# Patient Record
Sex: Female | Born: 1960 | Race: Black or African American | Hispanic: No | Smoking: Current every day smoker
Health system: Southern US, Community
[De-identification: ages and names within clinical notes are randomized; demographics above are authoritative.]

## PROBLEM LIST (undated history)

## (undated) DIAGNOSIS — D649 Anemia, unspecified: Secondary | ICD-10-CM

## (undated) DIAGNOSIS — F419 Anxiety disorder, unspecified: Secondary | ICD-10-CM

## (undated) DIAGNOSIS — F329 Major depressive disorder, single episode, unspecified: Secondary | ICD-10-CM

## (undated) DIAGNOSIS — E119 Type 2 diabetes mellitus without complications: Secondary | ICD-10-CM

## (undated) DIAGNOSIS — I10 Essential (primary) hypertension: Secondary | ICD-10-CM

## (undated) DIAGNOSIS — K5792 Diverticulitis of intestine, part unspecified, without perforation or abscess without bleeding: Secondary | ICD-10-CM

## (undated) DIAGNOSIS — J45909 Unspecified asthma, uncomplicated: Secondary | ICD-10-CM

## (undated) DIAGNOSIS — K589 Irritable bowel syndrome without diarrhea: Secondary | ICD-10-CM

## (undated) DIAGNOSIS — I219 Acute myocardial infarction, unspecified: Secondary | ICD-10-CM

## (undated) DIAGNOSIS — I2510 Atherosclerotic heart disease of native coronary artery without angina pectoris: Secondary | ICD-10-CM

## (undated) DIAGNOSIS — E079 Disorder of thyroid, unspecified: Secondary | ICD-10-CM

## (undated) DIAGNOSIS — E785 Hyperlipidemia, unspecified: Secondary | ICD-10-CM

## (undated) DIAGNOSIS — F32A Depression, unspecified: Secondary | ICD-10-CM

## (undated) HISTORY — PX: ABDOMINAL HYSTERECTOMY: SHX81

## (undated) HISTORY — PX: JOINT REPLACEMENT: SHX530

## (undated) HISTORY — PX: CARDIAC SURGERY: SHX584

## (undated) HISTORY — PX: SKIN GRAFT: SHX250

---

## 2017-04-27 ENCOUNTER — Emergency Department: Payer: Medicaid Other

## 2017-04-27 ENCOUNTER — Observation Stay
Admission: EM | Admit: 2017-04-27 | Discharge: 2017-04-28 | Disposition: A | Discharge status: Home / Self Care | Payer: Medicaid Other | Attending: Internal Medicine | Admitting: Internal Medicine

## 2017-04-27 DIAGNOSIS — Z880 Allergy status to penicillin: Secondary | ICD-10-CM | POA: Insufficient documentation

## 2017-04-27 DIAGNOSIS — R748 Abnormal levels of other serum enzymes: Secondary | ICD-10-CM | POA: Insufficient documentation

## 2017-04-27 DIAGNOSIS — F329 Major depressive disorder, single episode, unspecified: Secondary | ICD-10-CM | POA: Diagnosis not present

## 2017-04-27 DIAGNOSIS — F17200 Nicotine dependence, unspecified, uncomplicated: Secondary | ICD-10-CM | POA: Diagnosis not present

## 2017-04-27 DIAGNOSIS — N179 Acute kidney failure, unspecified: Secondary | ICD-10-CM | POA: Insufficient documentation

## 2017-04-27 DIAGNOSIS — Z881 Allergy status to other antibiotic agents status: Secondary | ICD-10-CM | POA: Diagnosis not present

## 2017-04-27 DIAGNOSIS — R9431 Abnormal electrocardiogram [ECG] [EKG]: Secondary | ICD-10-CM | POA: Diagnosis not present

## 2017-04-27 DIAGNOSIS — Z9049 Acquired absence of other specified parts of digestive tract: Secondary | ICD-10-CM | POA: Diagnosis not present

## 2017-04-27 DIAGNOSIS — R079 Chest pain, unspecified: Secondary | ICD-10-CM | POA: Diagnosis present

## 2017-04-27 DIAGNOSIS — R110 Nausea: Secondary | ICD-10-CM | POA: Insufficient documentation

## 2017-04-27 DIAGNOSIS — R0789 Other chest pain: Secondary | ICD-10-CM | POA: Diagnosis not present

## 2017-04-27 DIAGNOSIS — E785 Hyperlipidemia, unspecified: Secondary | ICD-10-CM | POA: Diagnosis not present

## 2017-04-27 DIAGNOSIS — Z888 Allergy status to other drugs, medicaments and biological substances status: Secondary | ICD-10-CM | POA: Insufficient documentation

## 2017-04-27 DIAGNOSIS — I10 Essential (primary) hypertension: Secondary | ICD-10-CM | POA: Diagnosis present

## 2017-04-27 DIAGNOSIS — Z951 Presence of aortocoronary bypass graft: Secondary | ICD-10-CM | POA: Diagnosis not present

## 2017-04-27 DIAGNOSIS — I2510 Atherosclerotic heart disease of native coronary artery without angina pectoris: Secondary | ICD-10-CM | POA: Diagnosis not present

## 2017-04-27 DIAGNOSIS — F419 Anxiety disorder, unspecified: Secondary | ICD-10-CM | POA: Diagnosis not present

## 2017-04-27 DIAGNOSIS — I252 Old myocardial infarction: Secondary | ICD-10-CM | POA: Diagnosis not present

## 2017-04-27 DIAGNOSIS — R778 Other specified abnormalities of plasma proteins: Secondary | ICD-10-CM

## 2017-04-27 DIAGNOSIS — Z9660 Presence of unspecified orthopedic joint implant: Secondary | ICD-10-CM | POA: Insufficient documentation

## 2017-04-27 DIAGNOSIS — Z79899 Other long term (current) drug therapy: Secondary | ICD-10-CM | POA: Diagnosis not present

## 2017-04-27 DIAGNOSIS — Z794 Long term (current) use of insulin: Secondary | ICD-10-CM | POA: Diagnosis not present

## 2017-04-27 DIAGNOSIS — J449 Chronic obstructive pulmonary disease, unspecified: Secondary | ICD-10-CM | POA: Diagnosis not present

## 2017-04-27 DIAGNOSIS — E039 Hypothyroidism, unspecified: Secondary | ICD-10-CM | POA: Diagnosis not present

## 2017-04-27 DIAGNOSIS — Z882 Allergy status to sulfonamides status: Secondary | ICD-10-CM | POA: Insufficient documentation

## 2017-04-27 DIAGNOSIS — R7989 Other specified abnormal findings of blood chemistry: Secondary | ICD-10-CM

## 2017-04-27 DIAGNOSIS — E119 Type 2 diabetes mellitus without complications: Secondary | ICD-10-CM | POA: Insufficient documentation

## 2017-04-27 HISTORY — DX: Type 2 diabetes mellitus without complications: E11.9

## 2017-04-27 HISTORY — DX: Anemia, unspecified: D64.9

## 2017-04-27 HISTORY — DX: Irritable bowel syndrome without diarrhea: K58.9

## 2017-04-27 HISTORY — DX: Disorder of thyroid, unspecified: E07.9

## 2017-04-27 HISTORY — DX: Major depressive disorder, single episode, unspecified: F32.9

## 2017-04-27 HISTORY — DX: Depression, unspecified: F32.A

## 2017-04-27 HISTORY — DX: Unspecified asthma, uncomplicated: J45.909

## 2017-04-27 HISTORY — DX: Essential (primary) hypertension: I10

## 2017-04-27 HISTORY — DX: Anxiety disorder, unspecified: F41.9

## 2017-04-27 HISTORY — DX: Diverticulitis of intestine, part unspecified, without perforation or abscess without bleeding: K57.92

## 2017-04-27 HISTORY — DX: Acute myocardial infarction, unspecified: I21.9

## 2017-04-27 HISTORY — DX: Hyperlipidemia, unspecified: E78.5

## 2017-04-27 LAB — BASIC METABOLIC PANEL
Anion gap: 10 (ref 5–15)
BUN: 16 mg/dL (ref 6–20)
CHLORIDE: 97 mmol/L — AB (ref 101–111)
CO2: 23 mmol/L (ref 22–32)
Calcium: 8.9 mg/dL (ref 8.9–10.3)
Creatinine, Ser: 1.44 mg/dL — ABNORMAL HIGH (ref 0.44–1.00)
GFR, EST AFRICAN AMERICAN: 47 mL/min — AB (ref >60)
GFR, EST NON AFRICAN AMERICAN: 40 mL/min — AB (ref >60)
Glucose, Bld: 371 mg/dL — ABNORMAL HIGH (ref 65–99)
POTASSIUM: 3.9 mmol/L (ref 3.5–5.1)
SODIUM: 130 mmol/L — AB (ref 135–145)

## 2017-04-27 LAB — HEPATIC FUNCTION PANEL
ALT: 10 U/L — AB (ref 14–54)
AST: 22 U/L (ref 15–41)
Albumin: 3.9 g/dL (ref 3.5–5.0)
Alkaline Phosphatase: 73 U/L (ref 38–126)
BILIRUBIN INDIRECT: 0.5 mg/dL (ref 0.3–0.9)
Bilirubin, Direct: 0.2 mg/dL (ref 0.1–0.5)
TOTAL PROTEIN: 7.5 g/dL (ref 6.5–8.1)
Total Bilirubin: 0.7 mg/dL (ref 0.3–1.2)

## 2017-04-27 LAB — CBC WITH DIFFERENTIAL/PLATELET
BASOS ABS: 0.1 10*3/uL (ref 0–0.1)
BASOS PCT: 1 %
Eosinophils Absolute: 0.4 10*3/uL (ref 0–0.7)
Eosinophils Relative: 4 %
HEMATOCRIT: 40.8 % (ref 35.0–47.0)
HEMOGLOBIN: 14 g/dL (ref 12.0–16.0)
Lymphocytes Relative: 29 %
Lymphs Abs: 2.9 10*3/uL (ref 1.0–3.6)
MCH: 28 pg (ref 26.0–34.0)
MCHC: 34.3 g/dL (ref 32.0–36.0)
MCV: 81.5 fL (ref 80.0–100.0)
MONOS PCT: 8 %
Monocytes Absolute: 0.8 10*3/uL (ref 0.2–0.9)
NEUTROS ABS: 5.7 10*3/uL (ref 1.4–6.5)
NEUTROS PCT: 58 %
Platelets: 238 10*3/uL (ref 150–440)
RBC: 5 MIL/uL (ref 3.80–5.20)
RDW: 13.7 % (ref 11.5–14.5)
WBC: 9.9 10*3/uL (ref 3.6–11.0)

## 2017-04-27 LAB — LIPASE, BLOOD: Lipase: 39 U/L (ref 11–51)

## 2017-04-27 LAB — FIBRIN DERIVATIVES D-DIMER (ARMC ONLY): FIBRIN DERIVATIVES D-DIMER (ARMC): 879.04 ng{FEU}/mL — AB (ref 0.00–499.00)

## 2017-04-27 LAB — TROPONIN I: TROPONIN I: 0.03 ng/mL — AB (ref <0.03)

## 2017-04-27 LAB — BRAIN NATRIURETIC PEPTIDE: B NATRIURETIC PEPTIDE 5: 12 pg/mL (ref 0.0–100.0)

## 2017-04-27 MED ORDER — SODIUM CHLORIDE 0.9 % IV SOLN
INTRAVENOUS | Status: AC
  Administered 2017-04-27: via INTRAVENOUS

## 2017-04-27 MED ORDER — ASPIRIN 81 MG PO CHEW
243.0000 mg | CHEWABLE_TABLET | Freq: Once | ORAL | Status: AC
  Administered 2017-04-27: 243 mg via ORAL
  Filled 2017-04-27: qty 3

## 2017-04-27 MED ORDER — ACETAMINOPHEN 325 MG PO TABS: 650.0000 mg | ORAL_TABLET | Freq: Four times a day (QID) | ORAL | Status: DC | PRN

## 2017-04-27 MED ORDER — LEVOTHYROXINE SODIUM 50 MCG PO TABS: 175.0000 ug | ORAL_TABLET | Freq: Every day | ORAL | Status: DC

## 2017-04-27 MED ORDER — LABETALOL HCL 5 MG/ML IV SOLN: 10.0000 mg | INTRAVENOUS | Status: DC | PRN

## 2017-04-27 MED ORDER — MORPHINE SULFATE (PF) 2 MG/ML IV SOLN
2.0000 mg | INTRAVENOUS | Status: DC | PRN
  Administered 2017-04-27 – 2017-04-28 (×4): 2 mg via INTRAVENOUS
  Filled 2017-04-27 (×4): qty 1

## 2017-04-27 MED ORDER — CARVEDILOL PHOSPHATE ER 40 MG PO CP24
40.0000 mg | ORAL_CAPSULE | Freq: Every day | ORAL | Status: DC
  Administered 2017-04-28: 40 mg via ORAL
  Filled 2017-04-27: qty 1

## 2017-04-27 MED ORDER — HEPARIN SODIUM (PORCINE) 5000 UNIT/ML IJ SOLN
5000.0000 [IU] | Freq: Three times a day (TID) | INTRAMUSCULAR | Status: DC
  Administered 2017-04-28 (×2): 5000 [IU] via SUBCUTANEOUS
  Filled 2017-04-27 (×2): qty 1

## 2017-04-27 MED ORDER — CLOPIDOGREL BISULFATE 75 MG PO TABS
75.0000 mg | ORAL_TABLET | Freq: Every day | ORAL | Status: DC
  Administered 2017-04-28: 75 mg via ORAL
  Filled 2017-04-27: qty 1

## 2017-04-27 MED ORDER — PROMETHAZINE HCL 25 MG/ML IJ SOLN
12.5000 mg | Freq: Once | INTRAMUSCULAR | Status: AC
  Administered 2017-04-27: 12.5 mg via INTRAVENOUS
  Filled 2017-04-27: qty 1

## 2017-04-27 MED ORDER — SIMVASTATIN 20 MG PO TABS
40.0000 mg | ORAL_TABLET | Freq: Every day | ORAL | Status: DC
  Administered 2017-04-28: 40 mg via ORAL
  Filled 2017-04-27: qty 2

## 2017-04-27 MED ORDER — PROCHLORPERAZINE EDISYLATE 5 MG/ML IJ SOLN
5.0000 mg | INTRAMUSCULAR | Status: DC | PRN
  Filled 2017-04-27: qty 1

## 2017-04-27 MED ORDER — NITROGLYCERIN 2 % TD OINT
0.5000 [in_us] | TOPICAL_OINTMENT | Freq: Once | TRANSDERMAL | Status: AC
  Administered 2017-04-27: 0.5 [in_us] via TOPICAL
  Filled 2017-04-27: qty 1

## 2017-04-27 MED ORDER — ALUM & MAG HYDROXIDE-SIMETH 200-200-20 MG/5ML PO SUSP
30.0000 mL | Freq: Once | ORAL | Status: AC
  Administered 2017-04-27: 30 mL via ORAL
  Filled 2017-04-27: qty 30

## 2017-04-27 MED ORDER — INSULIN ASPART 100 UNIT/ML ~~LOC~~ SOLN
0.0000 [IU] | Freq: Four times a day (QID) | SUBCUTANEOUS | Status: DC
  Administered 2017-04-28: 7 [IU] via SUBCUTANEOUS
  Administered 2017-04-28: 1 [IU] via SUBCUTANEOUS
  Administered 2017-04-28 (×2): 5 [IU] via SUBCUTANEOUS
  Filled 2017-04-27 (×4): qty 1

## 2017-04-27 MED ORDER — ACETAMINOPHEN 650 MG RE SUPP: 650.0000 mg | Freq: Four times a day (QID) | RECTAL | Status: DC | PRN

## 2017-04-27 MED ORDER — ZOLPIDEM TARTRATE 5 MG PO TABS
5.0000 mg | ORAL_TABLET | Freq: Every evening | ORAL | Status: DC | PRN
  Administered 2017-04-27: 5 mg via ORAL
  Filled 2017-04-27: qty 1

## 2017-04-27 NOTE — ED Triage Notes (Signed)
Patient c/o central chest pain, nausea, emesis, left hand numbness, SOB.

## 2017-04-27 NOTE — ED Notes (Signed)
Admiting MD at bedside

## 2017-04-27 NOTE — ED Provider Notes (Signed)
[facility] Emergency Department Provider Note   ____________________________________________   First MD Initiated Contact with Patient 04/27/17 2015     (approximate)  I have reviewed the triage vital signs and the nursing notes.   HISTORY  Chief Complaint Chest Pain   HPI Desiree Carey is a 56 y.o. female Who reports she developed chest pains been going on for about a week. It seems to be heavy and tight. There is associated with some nausea and shortness of breath. Patient reports it seems to get worse when she exercises but does not improve if she rests. Does not really radiate. She is not sweaty with it. She reports she's had a heart attack but when she had a heart attack she had no symptoms. I did find catheter report in the old records from [place] where at [employer] that showed really pretty severe coronary artery disease. No some 2013. I cannot find any old EKGs.   Past Medical History:  Diagnosis Date  . Anemia   . Anxiety   . Asthma   . Depression   . Diabetes mellitus without complication (HCC)   . Diverticulitis   . Heart attack (HCC)   . Hyperlipemia   . Hypertension   . IBS (irritable bowel syndrome)   . Thyroid disease     Patient Active Problem List   Diagnosis Date Noted  . Diabetes (HCC) 04/27/2017  . Hypothyroidism 04/27/2017  . CAD (coronary artery disease) 04/27/2017  . Chest pain 04/27/2017  . HTN (hypertension) 04/27/2017  . HLD (hyperlipidemia) 04/27/2017    Past Surgical History:  Procedure Laterality Date  . ABDOMINAL HYSTERECTOMY    . CARDIAC SURGERY    . JOINT REPLACEMENT      Prior to Admission medications   Not on File    Allergies Clindamycin/lincomycin; Hydrocodone bitartrate er; Lidocaine; Metformin and related; Oxycodone; Penicillins; Pioglitazone hcl-glimepiride; Ramipril; Sertraline hcl; Sitagliptin; Sulfur; and Ondansetron  Family History  Problem Relation Age of Onset  . Diabetes Mother   .  Hypertension Mother   . Stroke Mother   . Heart attack Mother     Social History Social History   Tobacco Use  . Smoking status: Current Every Day Smoker  . Smokeless tobacco: Never Used  Substance Use Topics  . Alcohol use: No    Frequency: Never  . Drug use: Not on file    Review of Systems  Constitutional: No fever/chills Eyes: No visual changes. ENT: No sore throat. Cardiovascular: see history of present illness Respiratory: Denies shortness of breath. Gastrointestinal: No abdominal pain.nausea, no vomiting.  No diarrhea.  No constipation. Genitourinary: Negative for dysuria. Musculoskeletal: Negative for back pain. Skin: Negative for rash. Neurological: Negative for headaches, focal weakness or   ____________________________________________   PHYSICAL EXAM:  VITAL SIGNS: ED Triage Vitals [04/27/17 1927]  Enc Vitals Group     BP 119/80     Pulse Rate 99     Resp 18     Temp 98.7 F (37.1 C)     Temp Source Oral     SpO2 100 %     Weight 165 lb (74.8 kg)     Height 5\' 7"  (1.702 m)     Head Circumference      Peak Flow      Pain Score 10     Pain Loc      Pain Edu?      Excl. in GC?     Constitutional: Alert and oriented. Well appearing  and in no acute distress. Eyes: Conjunctivae are normal.  Head: Atraumatic. Nose: No congestion/rhinnorhea. Mouth/Throat: Mucous membranes are moist.  Oropharynx non-erythematous. Neck: No stridor.  Cardiovascular: Normal rate, regular rhythm. Grossly normal heart sounds.  Good peripheral circulation. Respiratory: Normal respiratory effort.  No retractions. Lungs CTAB.palpation over the lower breastbone reproduces the pain. Gastrointestinal: Soft and nontender except for in the epigastric area palpation there also causes the patient to have pain and this is also similar to the pain she's been having. No distention. No abdominal bruits. No CVA tenderness. Musculoskeletal: No lower extremity tenderness nor edema.  No  joint effusions. Neurologic:  Normal speech and language. No gross focal neurologic deficits are appreciated. No gait instability. Skin:  Skin is warm, dry and intact. patient has some areas especially of the shin or to one on each shin approximately the size of the nickel somewhat raised somewhat tender erythematous with scabs in the center that look like healing boils. Patient reports they were not red or raised or tender did not drain any pus to just kind of turned up to where they are now. Psychiatric: Mood and affect are normal. Speech and behavior are normal.  ____________________________________________   LABS (all labs ordered are listed, but only abnormal results are displayed)  Labs Reviewed  BASIC METABOLIC PANEL - Abnormal; Notable for the following components:      Result Value   Sodium 130 (*)    Chloride 97 (*)    Glucose, Bld 371 (*)    Creatinine, Ser 1.44 (*)    GFR calc non Af Amer 40 (*)    GFR calc Af Amer 47 (*)    All other components within normal limits  TROPONIN I - Abnormal; Notable for the following components:   Troponin I 0.03 (*)    All other components within normal limits  HEPATIC FUNCTION PANEL  LIPASE, BLOOD  CBC WITH DIFFERENTIAL/PLATELET  FIBRIN DERIVATIVES D-DIMER ([facility] ONLY)  BRAIN NATRIURETIC PEPTIDE  URINALYSIS, COMPLETE (UACMP) WITH MICROSCOPIC  TROPONIN I   ____________________________________________  EKG  EKG read and interpreted by me shows normal sinus rhythm rate of 100 left axis there are flipped T's in 1 and L and somewhat flattened T's in V6. I cannot find any old EKGs in the old records. ____________________________________________  RADIOLOGY  Dg Chest 2 View  Result Date: 04/27/2017 CLINICAL DATA:  Central chest pain, nausea and vomiting.  Smoker. EXAM: CHEST  2 VIEW COMPARISON:  None. FINDINGS: Normal sized heart. Post CABG changes. Small amount of curvilinear density at the left lateral lung base. Otherwise, clear  lungs. Minimal diffuse peribronchial thickening. Minimal hyperexpansion of the lungs. Thoracic spine degenerative changes. Cholecystectomy clips. IMPRESSION: 1. Small amount of curvilinear atelectasis or scarring at the left lateral lung base. 2. Minimal changes of COPD and chronic bronchitis. Electronically Signed   By: Beckie SaltsSteven  Reid M.D.   On: 04/27/2017 20:20   chest x-ray almost could be a little bit of congestive failure. ____________________________________________   PROCEDURES  Procedure(s) performed:   Procedures  Critical Care performed:  ____________________________________________   INITIAL IMPRESSION / ASSESSMENT AND PLAN / ED COURSE  As part of my medical decision making, I reviewed the following data within the electronic MEDICAL RECORD NUMBER    patient has atypical chest pain partially reproducible by palpation but her EKG is abnormal she is diabetic and has a history of a markedly abnormal cardiac catheterization. Her troponin is somewhat elevated. I do not feel it is safe to let  her go home with this history. We will observe her for chest pain overnight.   ____________________________________________   FINAL CLINICAL IMPRESSION(S) / ED DIAGNOSES  Final diagnoses:  Chest pain, unspecified type  Abnormal EKG  Elevated troponin     ED Discharge Orders    None       Note:  This document was prepared using Dragon voice recognition software and may include unintentional dictation errors.    Arnaldo NatalMalinda, Paul F, MD 04/27/17 2102

## 2017-04-27 NOTE — H&P (Signed)
[facility] - [place] at [facility]   PATIENT NAME: Desiree Carey    MR#:  [id number]  DATE OF BIRTH:  [birth year]  DATE OF ADMISSION:  04/27/2017  PRIMARY CARE PHYSICIAN: Patient, No Pcp Per   REQUESTING/REFERRING PHYSICIAN: Darnelle Catalan, MD  CHIEF COMPLAINT:   Chief Complaint  Patient presents with  . Chest Pain    HISTORY OF PRESENT ILLNESS:  Desiree Carey  is a 56 y.o. female who presents with several days of chest pain.  Patient has prior cardiac history including coronary artery disease requiring intervention and prior MI.  She states that she was trying to "ignore" this pain because she did not want to come to the hospital.  However, the pain has been persistent and nagging and even a little bit worse today, so she came for evaluation.  Initial workup here is largely within normal limits except for borderline troponin at 0.03.  However, given the persistence of her symptoms in the setting of her prior cardiac history, hospitalist were called for admission and further evaluation  PAST MEDICAL HISTORY:   Past Medical History:  Diagnosis Date  . Anemia   . Anxiety   . Asthma   . Depression   . Diabetes mellitus without complication (HCC)   . Diverticulitis   . Heart attack (HCC)   . Hyperlipemia   . Hypertension   . IBS (irritable bowel syndrome)   . Thyroid disease     PAST SURGICAL HISTORY:   Past Surgical History:  Procedure Laterality Date  . ABDOMINAL HYSTERECTOMY    . CARDIAC SURGERY    . JOINT REPLACEMENT      SOCIAL HISTORY:   Social History   Tobacco Use  . Smoking status: Current Every Day Smoker  . Smokeless tobacco: Never Used  Substance Use Topics  . Alcohol use: No    Frequency: Never    FAMILY HISTORY:   Family History  Problem Relation Age of Onset  . Diabetes Mother   . Hypertension Mother   . Stroke Mother   . Heart attack Mother     DRUG ALLERGIES:   Allergies  Allergen Reactions  .  Clindamycin/Lincomycin Other (See Comments)    Thrush   . Hydrocodone Bitartrate Er Itching  . Lidocaine   . Metformin And Related Other (See Comments)    Fatigue   . Oxycodone Other (See Comments)    Hallucination   . Penicillins Hives  . Pioglitazone Hcl-Glimepiride Hives  . Ramipril Swelling  . Sertraline Hcl Other (See Comments)    Hallucination   . Sitagliptin Other (See Comments)    Headache, chest pain  . Sulfur   . Ondansetron Rash    MEDICATIONS AT HOME:   Prior to Admission medications   Not on File    REVIEW OF SYSTEMS:  Review of Systems  Constitutional: Negative for chills, fever, malaise/fatigue and weight loss.  HENT: Negative for ear pain, hearing loss and tinnitus.   Eyes: Negative for blurred vision, double vision, pain and redness.  Respiratory: Negative for cough, hemoptysis and shortness of breath.   Cardiovascular: Positive for chest pain. Negative for palpitations, orthopnea and leg swelling.  Gastrointestinal: Negative for abdominal pain, constipation, diarrhea, nausea and vomiting.  Genitourinary: Negative for dysuria, frequency and hematuria.  Musculoskeletal: Negative for back pain, joint pain and neck pain.  Skin:       No acne, rash, or lesions  Neurological: Negative for dizziness, tremors, focal weakness and weakness.  Endo/Heme/Allergies: Negative for  polydipsia. Does not bruise/bleed easily.  Psychiatric/Behavioral: Negative for depression. The patient is not nervous/anxious and does not have insomnia.      VITAL SIGNS:   Vitals:   04/27/17 1927  BP: 119/80  Pulse: 99  Resp: 18  Temp: 98.7 F (37.1 C)  TempSrc: Oral  SpO2: 100%  Weight: 74.8 kg (165 lb)  Height: 5\' 7"  (1.702 m)   Wt Readings from Last 3 Encounters:  04/27/17 74.8 kg (165 lb)    PHYSICAL EXAMINATION:  Physical Exam  Vitals reviewed. Constitutional: She is oriented to person, place, and time. She appears well-developed and well-nourished. No distress.   HENT:  Head: Normocephalic and atraumatic.  Mouth/Throat: Oropharynx is clear and moist.  Eyes: Conjunctivae and EOM are normal. Pupils are equal, round, and reactive to light. No scleral icterus.  Neck: Normal range of motion. Neck supple. No JVD present. No thyromegaly present.  Cardiovascular: Normal rate, regular rhythm and intact distal pulses. Exam reveals no gallop and no friction rub.  No murmur heard. Respiratory: Effort normal and breath sounds normal. No respiratory distress. She has no wheezes. She has no rales.  GI: Soft. Bowel sounds are normal. She exhibits no distension. There is no tenderness.  Musculoskeletal: Normal range of motion. She exhibits no edema.  No arthritis, no gout  Lymphadenopathy:    She has no cervical adenopathy.  Neurological: She is alert and oriented to person, place, and time. No cranial nerve deficit.  No dysarthria, no aphasia  Skin: Skin is warm and dry. No rash noted. No erythema.  Psychiatric: She has a normal mood and affect. Her behavior is normal. Judgment and thought content normal.    LABORATORY PANEL:   CBC No results for input(s): WBC, HGB, HCT, PLT in the last 168 hours. ------------------------------------------------------------------------------------------------------------------  Chemistries  Recent Labs  Lab 04/27/17 1928  NA 130*  K 3.9  CL 97*  CO2 23  GLUCOSE 371*  BUN 16  CREATININE 1.44*  CALCIUM 8.9   ------------------------------------------------------------------------------------------------------------------  Cardiac Enzymes Recent Labs  Lab 04/27/17 1928  TROPONINI 0.03*   ------------------------------------------------------------------------------------------------------------------  RADIOLOGY:  Dg Chest 2 View  Result Date: 04/27/2017 CLINICAL DATA:  Central chest pain, nausea and vomiting.  Smoker. EXAM: CHEST  2 VIEW COMPARISON:  None. FINDINGS: Normal sized heart. Post CABG changes.  Small amount of curvilinear density at the left lateral lung base. Otherwise, clear lungs. Minimal diffuse peribronchial thickening. Minimal hyperexpansion of the lungs. Thoracic spine degenerative changes. Cholecystectomy clips. IMPRESSION: 1. Small amount of curvilinear atelectasis or scarring at the left lateral lung base. 2. Minimal changes of COPD and chronic bronchitis. Electronically Signed   By: Beckie SaltsSteven  Reid M.D.   On: 04/27/2017 20:20    EKG:   Orders placed or performed during the hospital encounter of 04/27/17  . EKG 12-Lead  . EKG 12-Lead  . ED EKG within 10 minutes  . ED EKG within 10 minutes    IMPRESSION AND PLAN:  Principal Problem:   Chest pain -trend cardiac enzymes, get an echocardiogram and a cardiology consult Active Problems:   CAD (coronary artery disease) -continue home meds   AKI (acute kidney injury) (HCC) -gentle IV fluids tonight, avoid nephrotoxins and monitor   Diabetes (HCC) -sliding scale insulin with corresponding glucose checks   HTN (hypertension) -continue home medications   Hypothyroidism -home dose thyroid replacement   HLD (hyperlipidemia) -continue home meds  All the records are reviewed and case discussed with ED provider. Management plans discussed with the patient  and/or family.  DVT PROPHYLAXIS: SubQ heparin  GI PROPHYLAXIS: None  ADMISSION STATUS: Observation  CODE STATUS: Full Code Status History    This patient does not have a recorded code status. Please follow your organizational policy for patients in this situation.      TOTAL TIME TAKING CARE OF THIS PATIENT: 40 minutes.   Desiree Carey FIELDING 04/27/2017, 9:09 PM  [facility]  Office  [phone]  CC: Primary care physician; Patient, No Pcp Per  Note:  This document was prepared using Dragon voice recognition software and may include unintentional dictation errors.

## 2017-04-27 NOTE — ED Notes (Signed)
Dr. Malinda at bedside.  

## 2017-04-28 ENCOUNTER — Observation Stay
Admit: 2017-04-28 | Discharge: 2017-04-28 | Disposition: A | Discharge status: Home / Self Care | Payer: Medicaid Other | Attending: Internal Medicine | Admitting: Internal Medicine

## 2017-04-28 ENCOUNTER — Other Ambulatory Visit: Payer: Self-pay

## 2017-04-28 LAB — URINALYSIS, COMPLETE (UACMP) WITH MICROSCOPIC
Bilirubin Urine: NEGATIVE
Hgb urine dipstick: NEGATIVE
Ketones, ur: NEGATIVE mg/dL
NITRITE: NEGATIVE
PROTEIN: NEGATIVE mg/dL
Specific Gravity, Urine: 1.006 (ref 1.005–1.030)
Squamous Epithelial / LPF: NONE SEEN
pH: 6 (ref 5.0–8.0)

## 2017-04-28 LAB — CBC
HCT: 38.5 % (ref 35.0–47.0)
HEMOGLOBIN: 13.4 g/dL (ref 12.0–16.0)
MCH: 28.3 pg (ref 26.0–34.0)
MCHC: 34.7 g/dL (ref 32.0–36.0)
MCV: 81.5 fL (ref 80.0–100.0)
PLATELETS: 237 10*3/uL (ref 150–440)
RBC: 4.72 MIL/uL (ref 3.80–5.20)
RDW: 13.6 % (ref 11.5–14.5)
WBC: 8.3 10*3/uL (ref 3.6–11.0)

## 2017-04-28 LAB — ECHOCARDIOGRAM COMPLETE
HEIGHTINCHES: 67 in
WEIGHTICAEL: 2640 [oz_av]

## 2017-04-28 LAB — BASIC METABOLIC PANEL
ANION GAP: 10 (ref 5–15)
BUN: 14 mg/dL (ref 6–20)
CALCIUM: 8.5 mg/dL — AB (ref 8.9–10.3)
CO2: 24 mmol/L (ref 22–32)
CREATININE: 1.12 mg/dL — AB (ref 0.44–1.00)
Chloride: 100 mmol/L — ABNORMAL LOW (ref 101–111)
GFR calc Af Amer: >60 mL/min (ref >60)
GFR, EST NON AFRICAN AMERICAN: 55 mL/min — AB (ref >60)
GLUCOSE: 317 mg/dL — AB (ref 65–99)
Potassium: 3.6 mmol/L (ref 3.5–5.1)
Sodium: 134 mmol/L — ABNORMAL LOW (ref 135–145)

## 2017-04-28 LAB — GLUCOSE, CAPILLARY
GLUCOSE-CAPILLARY: 146 mg/dL — AB (ref 65–99)
GLUCOSE-CAPILLARY: 261 mg/dL — AB (ref 65–99)
GLUCOSE-CAPILLARY: 278 mg/dL — AB (ref 65–99)
Glucose-Capillary: 315 mg/dL — ABNORMAL HIGH (ref 65–99)

## 2017-04-28 LAB — TROPONIN I
TROPONIN I: 0.03 ng/mL — AB (ref <0.03)
Troponin I: 0.04 ng/mL (ref <0.03)

## 2017-04-28 MED ORDER — PROMETHAZINE HCL 25 MG/ML IJ SOLN
12.5000 mg | Freq: Four times a day (QID) | INTRAMUSCULAR | Status: DC | PRN
  Administered 2017-04-28: 12.5 mg via INTRAVENOUS
  Filled 2017-04-28: qty 1

## 2017-04-28 MED ORDER — GABAPENTIN 800 MG PO TABS
800.0000 mg | ORAL_TABLET | Freq: Three times a day (TID) | ORAL | Status: DC
  Filled 2017-04-28: qty 1

## 2017-04-28 MED ORDER — GABAPENTIN 400 MG PO CAPS
800.0000 mg | ORAL_CAPSULE | Freq: Three times a day (TID) | ORAL | Status: DC
  Administered 2017-04-28 (×2): 800 mg via ORAL
  Filled 2017-04-28 (×4): qty 2

## 2017-04-28 MED ORDER — NICOTINE 14 MG/24HR TD PT24
14.0000 mg | MEDICATED_PATCH | Freq: Every day | TRANSDERMAL | Status: DC
  Administered 2017-04-28: 14 mg via TRANSDERMAL
  Filled 2017-04-28: qty 1

## 2017-04-28 MED ORDER — PROMETHAZINE HCL 12.5 MG PO TABS: 12.5000 mg | ORAL_TABLET | Freq: Three times a day (TID) | ORAL | 0 refills | Status: DC | PRN

## 2017-04-28 NOTE — Progress Notes (Signed)
IV d/c'd. Patient discharged to home. Taken out in wheelchair. Left with son and daughter.

## 2017-04-28 NOTE — Progress Notes (Signed)
*  PRELIMINARY RESULTS* Echocardiogram 2D Echocardiogram has been performed.  Desiree Carey 04/28/2017, 8:48 AM

## 2017-04-28 NOTE — Progress Notes (Signed)
A&O. Admitted with chest pain. Up with one assist. IV fluids infusing. Dr. Anne HahnWillis notified of continued chest pain. Morphine ordered and given. Bed alarm on. Instructed not to get up without assistance. Will continue to monitor.

## 2017-04-28 NOTE — Progress Notes (Signed)
Patient off unit for cardiac testing. Will resume care upon return. Desiree Carey  

## 2017-04-28 NOTE — Discharge Instructions (Signed)
Heart healthy diet  Resume all home medications as before

## 2017-04-29 LAB — HIV ANTIBODY (ROUTINE TESTING W REFLEX): HIV SCREEN 4TH GENERATION: NONREACTIVE

## 2017-04-30 NOTE — Discharge Summary (Signed)
[facility] - [place] at [facility]   PATIENT NAME: Desiree RiegerCharlotte Carey    MR#:  [id number]  DATE OF BIRTH:  [birth year]  DATE OF ADMISSION:  04/27/2017 ADMITTING PHYSICIAN: Oralia Manisavid Willis, MD  DATE OF DISCHARGE: 04/28/2017  9:07 PM  PRIMARY CARE PHYSICIAN: Patient, No Pcp Per   ADMISSION DIAGNOSIS:  Abnormal EKG [R94.31] Elevated troponin [R74.8] Chest pain, unspecified type [R07.9]  DISCHARGE DIAGNOSIS:  Principal Problem:   Chest pain Active Problems:   Diabetes (HCC)   Hypothyroidism   CAD (coronary artery disease)   HTN (hypertension)   HLD (hyperlipidemia)   AKI (acute kidney injury) (HCC)   SECONDARY DIAGNOSIS:   Past Medical History:  Diagnosis Date  . Anemia   . Anxiety   . Asthma   . Depression   . Diabetes mellitus without complication (HCC)   . Diverticulitis   . Heart attack (HCC)   . Hyperlipemia   . Hypertension   . IBS (irritable bowel syndrome)   . Thyroid disease      ADMITTING HISTORY  HISTORY OF PRESENT ILLNESS:  Desiree Carey  is a 56 y.o. female who presents with several days of chest pain.  Patient has prior cardiac history including coronary artery disease requiring intervention and prior MI.  She states that she was trying to "ignore" this pain because she did not want to come to the hospital.  However, the pain has been persistent and nagging and even a little bit worse today, so she came for evaluation.  Initial workup here is largely within normal limits except for borderline troponin at 0.03.  However, given the persistence of her symptoms in the setting of her prior cardiac history, hospitalist were called for admission and further evaluation     HOSPITAL COURSE:   *Chest pain, atypical.  Patient ruled out for acute coronary syndrome with normal troponin.  EKG showed nothing acute.  Telemetry with no arrhythmias.  Patient continued to have some nausea and pain in her chest.  Cardiology was consulted.  Discussed  with Dr. Darrold Spanolbert who saw the patient.  He did not think this was cardiac and suggested discharging patient home to follow-up with him in the office.  Patient does have multiple issues with pain medications.  Has requested medications for Ativan, pain medications, Phenergan.  Advised that these medications will need to be refilled by primary care physician.  The patient was chest pain-free and back to baseline by the time of discharge.  Discharged home in stable condition.  CONSULTS OBTAINED:    DRUG ALLERGIES:   Allergies  Allergen Reactions  . Clindamycin/Lincomycin Other (See Comments)    Thrush   . Hydrocodone Bitartrate Er Itching  . Lidocaine   . Metformin And Related Other (See Comments)    Fatigue   . Oxycodone Other (See Comments)    Hallucination   . Penicillins Hives  . Pioglitazone Hcl-Glimepiride Hives  . Ramipril Swelling  . Sertraline Hcl Other (See Comments)    Hallucination   . Sitagliptin Other (See Comments)    Headache, chest pain  . Sulfur   . Ondansetron Rash    DISCHARGE MEDICATIONS:   Discharge Medication List as of 04/28/2017  6:51 PM    START taking these medications   Details  promethazine (PHENERGAN) 12.5 MG tablet Take 1 tablet (12.5 mg total) every 8 (eight) hours as needed by mouth for nausea or vomiting., Starting Sat 04/28/2017, Print      CONTINUE these medications which have NOT CHANGED  Details  carvedilol (COREG CR) 40 MG 24 hr capsule Take 40 mg daily by mouth., Historical Med    clopidogrel (PLAVIX) 75 MG tablet Take 75 mg daily by mouth., Historical Med    levothyroxine (SYNTHROID, LEVOTHROID) 175 MCG tablet Take 175 mcg daily before breakfast by mouth., Historical Med    lisinopril (PRINIVIL,ZESTRIL) 40 MG tablet Take 40 mg daily by mouth., Historical Med    simvastatin (ZOCOR) 40 MG tablet Take 40 mg daily by mouth., Historical Med    zolpidem (AMBIEN) 5 MG tablet Take 5 mg at bedtime as needed by mouth for sleep.,  Historical Med        Today   VITAL SIGNS:  Blood pressure 109/71, pulse 75, temperature 98.2 F (36.8 C), temperature source Oral, resp. rate 18, height 5\' 7"  (1.702 m), weight 74.8 kg (165 lb), SpO2 94 %.  I/O:  No intake or output data in the 24 hours ending 04/30/17 1257  PHYSICAL EXAMINATION:  Physical Exam  GENERAL:  56 y.o.-year-old patient lying in the bed with no acute distress.  LUNGS: Normal breath sounds bilaterally, no wheezing, rales,rhonchi or crepitation. No use of accessory muscles of respiration.  CARDIOVASCULAR: S1, S2 normal. No murmurs, rubs, or gallops.  ABDOMEN: Soft, non-tender, non-distended. Bowel sounds present. No organomegaly or mass.  NEUROLOGIC: Moves all 4 extremities. PSYCHIATRIC: The patient is alert and oriented x 3.  SKIN: No obvious rash, lesion, or ulcer.   DATA REVIEW:   CBC Recent Labs  Lab 04/28/17 0147  WBC 8.3  HGB 13.4  HCT 38.5  PLT 237    Chemistries  Recent Labs  Lab 04/27/17 2107 04/28/17 0147  NA  --  134*  K  --  3.6  CL  --  100*  CO2  --  24  GLUCOSE  --  317*  BUN  --  14  CREATININE  --  1.12*  CALCIUM  --  8.5*  AST 22  --   ALT 10*  --   ALKPHOS 73  --   BILITOT 0.7  --     Cardiac Enzymes Recent Labs  Lab 04/28/17 0729  TROPONINI 0.03*    Microbiology Results  No results found for this or any previous visit.  RADIOLOGY:  No results found.  Follow up with PCP in 1 week.  Management plans discussed with the patient, family and they are in agreement.  CODE STATUS:  Code Status History    Date Active Date Inactive Code Status Order ID Comments User Context   04/27/2017 22:49 04/29/2017 00:13 Full Code [id number]  Oralia ManisWillis, David, MD Inpatient      TOTAL TIME TAKING CARE OF THIS PATIENT ON DAY OF DISCHARGE: more than 30 minutes.   Molinda BailiffSrikar R Rhaelyn Giron M.D on 04/30/2017 at 12:57 PM  Between 7am to 6pm - Pager - [phone]  After 6pm go to [email or web address] - password [email or web address]  [facility]  [place] [facility]  Office  [phone]  CC: Primary care physician; Patient, No Pcp Per  Note: This dictation was prepared with Dragon dictation along with smaller phrase technology. Any transcriptional errors that result from this process are unintentional.

## 2017-05-25 NOTE — Consult Note (Signed)
Reason for Consult: Chest pain possible angina Referring Physician: Dr. Winfield Rast hospitalist  Desiree Carey is an 55 y.o. female.  HPI: The patient is a 56 year old female presents with several days worth of chest pain.  Patient has had a prior cardiac cath with known coronary disease requiring intervention for myocardial infarction patient is unsure how long ago.  Patient states she has been having lingering on and off chest discomfort she finally came to the hospital.  Patient has had persistent nagging mid chest discomfort with shortness of breath underwent EKG and troponins which were essentially unremarkable patient denies any buccal spells or headaches syncope has been compliant with her medications but unfortunately still smokes her for further cardiac assessment  Past Medical History:  Diagnosis Date  . Anemia   . Anxiety   . Asthma   . Depression   . Diabetes mellitus without complication (HCC)   . Diverticulitis   . Heart attack (HCC)   . Hyperlipemia   . Hypertension   . IBS (irritable bowel syndrome)   . Thyroid disease     Past Surgical History:  Procedure Laterality Date  . ABDOMINAL HYSTERECTOMY    . CARDIAC SURGERY    . JOINT REPLACEMENT      Family History  Problem Relation Age of Onset  . Diabetes Mother   . Hypertension Mother   . Stroke Mother   . Heart attack Mother     Social History:  reports that she has been smoking.  she has never used smokeless tobacco. She reports that she does not drink alcohol. Her drug history is not on file.  Allergies:  Allergies  Allergen Reactions  . Clindamycin/Lincomycin Other (See Comments)    Thrush   . Hydrocodone Bitartrate Er Itching  . Lidocaine   . Metformin And Related Other (See Comments)    Fatigue   . Oxycodone Other (See Comments)    Hallucination   . Penicillins Hives  . Pioglitazone Hcl-Glimepiride Hives  . Ramipril Swelling  . Sertraline Hcl Other (See Comments)    Hallucination   .  Sitagliptin Other (See Comments)    Headache, chest pain  . Sulfur   . Ondansetron Rash    Medications: I have reviewed the patient's current medications.  No results found for this or any previous visit (from the past 48 hour(s)).  No results found.  Review of Systems  Constitutional: Negative.   HENT: Negative.   Eyes: Negative.   Respiratory: Positive for shortness of breath.   Cardiovascular: Positive for chest pain.  Gastrointestinal: Negative.   Genitourinary: Negative.   Skin: Negative.   Endo/Heme/Allergies: Negative.   Psychiatric/Behavioral: Negative.    Blood pressure 109/71, pulse 75, temperature 98.2 F (36.8 C), temperature source Oral, resp. rate 18, height  (1.702 m), weight 165 lb (74.8 kg), SpO2 94 %. Physical Exam  Nursing note and vitals reviewed. Constitutional: She is oriented to person, place, and time. She appears well-developed and well-nourished.  HENT:  Head: Normocephalic and atraumatic.  Eyes: Conjunctivae and EOM are normal. Pupils are equal, round, and reactive to light.  Neck: Normal range of motion. Neck supple.  Cardiovascular: Normal rate, regular rhythm and normal heart sounds.  Respiratory: Effort normal and breath sounds normal.  GI: Soft. Bowel sounds are normal.  Musculoskeletal: Normal range of motion.  Neurological: She is alert and oriented to person, place, and time. She has normal reflexes.  Skin: Skin is warm and dry.  Psychiatric: She has a normal mood  and affect.    Assessment/Plan: Chest pain Possible angina Coronary artery disease Acute on chronic renal insufficiency Diabetes Hypertension Hyperlipidemia Hypothyroidism Smoking . Plan Agree with admission to rule out for myocardial infarction follow-up cardiac enzymes and EKG Consider echocardiogram for further assessment For the patient to nephrology for renal insufficiency recommend hydration Continue insulin therapy for diabetes management Agree with  statin therapy for hyperlipidemia Recommend patient avoid tobacco abuse Echocardiogram will be helpful for further assessment evaluate Do not recommend cardiac cath at this point   Desiree Carey 05/25/2017, 8:24 AM

## 2017-05-26 ENCOUNTER — Other Ambulatory Visit: Payer: Self-pay

## 2017-05-26 ENCOUNTER — Emergency Department
Admission: EM | Admit: 2017-05-26 | Discharge: 2017-05-27 | Disposition: A | Discharge status: Home / Self Care | Payer: Medicaid Other | Attending: Emergency Medicine | Admitting: Emergency Medicine

## 2017-05-26 ENCOUNTER — Emergency Department: Payer: Medicaid Other

## 2017-05-26 DIAGNOSIS — M79641 Pain in right hand: Secondary | ICD-10-CM | POA: Diagnosis not present

## 2017-05-26 DIAGNOSIS — F17200 Nicotine dependence, unspecified, uncomplicated: Secondary | ICD-10-CM | POA: Diagnosis not present

## 2017-05-26 DIAGNOSIS — Z79899 Other long term (current) drug therapy: Secondary | ICD-10-CM | POA: Diagnosis not present

## 2017-05-26 DIAGNOSIS — I2510 Atherosclerotic heart disease of native coronary artery without angina pectoris: Secondary | ICD-10-CM | POA: Insufficient documentation

## 2017-05-26 DIAGNOSIS — E039 Hypothyroidism, unspecified: Secondary | ICD-10-CM | POA: Diagnosis not present

## 2017-05-26 DIAGNOSIS — E119 Type 2 diabetes mellitus without complications: Secondary | ICD-10-CM | POA: Insufficient documentation

## 2017-05-26 DIAGNOSIS — J45909 Unspecified asthma, uncomplicated: Secondary | ICD-10-CM | POA: Insufficient documentation

## 2017-05-26 DIAGNOSIS — I10 Essential (primary) hypertension: Secondary | ICD-10-CM | POA: Diagnosis not present

## 2017-05-26 DIAGNOSIS — Z7902 Long term (current) use of antithrombotics/antiplatelets: Secondary | ICD-10-CM | POA: Insufficient documentation

## 2017-05-26 NOTE — ED Triage Notes (Addendum)
Reports swelling and pain to right hand.  Reports had surgery on hand in June.  Also reports has appointment for same on Monday states "since my daughter was coming I decided to be seen too and not wait for appointment."

## 2017-05-26 NOTE — ED Notes (Signed)
Pt to room att, XR waiting

## 2017-05-27 LAB — CBC
HEMATOCRIT: 37 % (ref 35.0–47.0)
HEMOGLOBIN: 12.9 g/dL (ref 12.0–16.0)
MCH: 28.4 pg (ref 26.0–34.0)
MCHC: 34.9 g/dL (ref 32.0–36.0)
MCV: 81.3 fL (ref 80.0–100.0)
Platelets: 229 10*3/uL (ref 150–440)
RBC: 4.55 MIL/uL (ref 3.80–5.20)
RDW: 13.9 % (ref 11.5–14.5)
WBC: 9 10*3/uL (ref 3.6–11.0)

## 2017-05-27 LAB — C-REACTIVE PROTEIN: CRP: <0.8 mg/dL (ref <1.0)

## 2017-05-27 LAB — SEDIMENTATION RATE: Sed Rate: 23 mm/hr (ref 0–30)

## 2017-05-27 MED ORDER — TRAMADOL HCL 50 MG PO TABS
50.0000 mg | ORAL_TABLET | ORAL | Status: AC
  Administered 2017-05-27: 50 mg via ORAL
  Filled 2017-05-27: qty 1

## 2017-05-27 NOTE — ED Notes (Signed)
ED Provider at bedside. 

## 2017-05-27 NOTE — Discharge Instructions (Signed)
[facility]. Continue your current medications.  Please return the emergency room right away if you develop a fever, redness, increased swelling, loss of sensation, weakness in the hand, failure symptoms are worsening, or other new concerns arise.

## 2017-05-27 NOTE — ED Provider Notes (Signed)
[facility] Emergency Department Provider Note  ____________________________________________   First MD Initiated Contact with Patient 05/27/17 0015     (approximate)  I have reviewed the triage vital signs and the nursing notes.   HISTORY  Chief Complaint Hand Pain   HPI Desiree Carey is a 56 y.o. female presents for evaluation of pain in the right hand middle finger and palm  Patient reports that several months ago she had surgery for a trigger finger and since that time she has had ongoing pain discomfort in the right hand.  She reports for few weeks time the finger worked better, but for the last several months she is continue to have issues with the trigger finger and it will not bend all the way.  Because her pain and discomfort and has been treated with tramadol by hand surgeon where she is previously from.  She reports she followed up with orthopedics here and they evaluated it and told her that they were concerned about infection and started on clindamycin last week.  She reports the pain is been ongoing, but the finger hurts whenever it is moved in the finger has been swollen for several months now, she denies any redness in the skin and denies any fevers or chills.  She denies any pain elsewhere in the hand.  No cold blue or numb tip of the finger.  She reports a stinging burning discomfort and movement, she reports the pain and burning is been there for about 2+ months  Past Medical History:  Diagnosis Date  . Anemia   . Anxiety   . Asthma   . Depression   . Diabetes mellitus without complication ([place])   . Diverticulitis   . Heart attack ([place])   . Hyperlipemia   . Hypertension   . IBS (irritable bowel syndrome)   . Thyroid disease     Patient Active Problem List   Diagnosis Date Noted  . Diabetes ([place]) 04/27/2017  . Hypothyroidism 04/27/2017  . CAD (coronary artery disease) 04/27/2017  . Chest pain 04/27/2017  . HTN (hypertension)  04/27/2017  . HLD (hyperlipidemia) 04/27/2017  . AKI (acute kidney injury) ([place]) 04/27/2017    Past Surgical History:  Procedure Laterality Date  . ABDOMINAL HYSTERECTOMY    . CARDIAC SURGERY    . JOINT REPLACEMENT      Prior to Admission medications   Medication Sig Start Date End Date Taking? Authorizing Provider  carvedilol (COREG CR) 40 MG 24 hr capsule Take 40 mg daily by mouth.    [provider]  clopidogrel (PLAVIX) 75 MG tablet Take 75 mg daily by mouth.    [provider]  levothyroxine (SYNTHROID, LEVOTHROID) 175 MCG tablet Take 175 mcg daily before breakfast by mouth.    [provider]  lisinopril (PRINIVIL,ZESTRIL) 40 MG tablet Take 40 mg daily by mouth.    [provider]  promethazine (PHENERGAN) 12.5 MG tablet Take 1 tablet (12.5 mg total) every 8 (eight) hours as needed by mouth for nausea or vomiting. 04/28/17   Hillary Bow, MD  simvastatin (ZOCOR) 40 MG tablet Take 40 mg daily by mouth.    [provider]  zolpidem (AMBIEN) 5 MG tablet Take 5 mg at bedtime as needed by mouth for sleep.    [provider]    Allergies Clindamycin/lincomycin; Hydrocodone bitartrate er; Lidocaine; Metformin and related; Oxycodone; Penicillins; Pioglitazone hcl-glimepiride; Ramipril; Sertraline hcl; Sitagliptin; Sulfur; and Ondansetron  Family History  Problem Relation Age of Onset  .  Diabetes Mother   . Hypertension Mother   . Stroke Mother   . Heart attack Mother     Social History Social History   Tobacco Use  . Smoking status: Current Every Day Smoker  . Smokeless tobacco: Never Used  Substance Use Topics  . Alcohol use: No    Frequency: Never  . Drug use: Not on file    Review of Systems Constitutional: No fever/chills Eyes: No visual changes. ENT: No sore throat. Cardiovascular: Denies chest pain. Respiratory: Denies shortness of breath. Gastrointestinal: No abdominal pain.  Musculoskeletal: Negative  for back pain. Skin: Negative for rash. Neurological: Negative for headaches, focal weakness or numbness.    ____________________________________________   PHYSICAL EXAM:  VITAL SIGNS: ED Triage Vitals [05/26/17 2311]  Enc Vitals Group     BP (!) 137/103     Pulse Rate (!) 102     Resp 16     Temp 98.2 F (36.8 C)     Temp Source Oral     SpO2 100 %     Weight 165 lb (74.8 kg)     Height 5' 7"  (1.702 m)     Head Circumference      Peak Flow      Pain Score      Pain Loc      Pain Edu?      Excl. in [place]?     Constitutional: Alert and oriented. Well appearing and in no acute distress. Eyes: Conjunctivae are normal. Head: Atraumatic. Nose: No congestion/rhinnorhea. Mouth/Throat: Mucous membranes are moist. Neck: No stridor.   Cardiovascular: Normal rate, regular rhythm. Grossly normal heart sounds.  Good peripheral circulation. Respiratory: Normal respiratory effort.  No retractions. Lungs CTAB. Gastrointestinal: Soft and nontender. No distention. Musculoskeletal: No lower extremity tenderness nor edema.  RIGHT Right upper extremity demonstrates normal strength, good use of all muscles. No edema bruising or contusions of the right shoulder/upper arm, right elbow, right forearm / hand. Full range of motion of the right right upper extremity without pain except in the right phalanx which is slightly edematous the patient reports pain when she attempts to flex or extend the right middle phalanx.  She has some limitation in bending of the right middle phalanx, but reports that the swelling pain and discomfort with motion has been present for several months now.  Denies any redness.  Has some minor scar tissue without erythema overlying the palm of the right hand.  There is no purulence, but there is no overlying erythema of any portion of the phalanx or hand.  There are no open wounds.  She reports tenderness along the entire finger, does not seem to localize to the flexor sheath  itself.  No evidence of trauma. Strong radial pulse. Intact median/ulnar/radial neuro-muscular exam.  LEFT Left upper extremity demonstrates normal strength, good use of all muscles. No edema bruising or contusions of the left shoulder/upper arm, left elbow, left forearm / hand. Full range of motion of the left  upper extremity without pain. No evidence of trauma. Strong radial pulse. Intact median/ulnar/radial neuro-muscular exam.   Neurologic:  Normal speech and language. No gross focal neurologic deficits are appreciated.  Skin:  Skin is warm, dry and intact. No rash noted. Psychiatric: Mood and affect are normal. Speech and behavior are normal.  ____________________________________________   LABS (all labs ordered are listed, but only abnormal results are displayed)  Labs Reviewed  CBC  SEDIMENTATION RATE  C-REACTIVE PROTEIN   ____________________________________________  EKG  ____________________________________________  RADIOLOGY  Dg Hand Complete Right  Result Date: 05/26/2017 CLINICAL DATA:  Pain and swelling. EXAM: RIGHT HAND - COMPLETE 3+ VIEW COMPARISON:  None. FINDINGS: There is no evidence of fracture or dislocation. Scattered osteoarthritis throughout the digits. No evidence of inflammatory arthropathy or bony destructive change. Soft tissue edema of the middle digit. No soft tissue air or radiopaque foreign body. IMPRESSION: Scattered osteoarthritis, no acute osseous abnormality. Soft tissue edema of the middle finger. Electronically Signed   By: Jeb Levering M.D.   On: 05/26/2017 23:45    X-ray reviewed, no acute changes other than soft tissue edema about the middle finger ____________________________________________   PROCEDURES  Procedure(s) performed: None  Procedures  Critical Care performed: No  ____________________________________________   INITIAL IMPRESSION / ASSESSMENT AND PLAN / ED COURSE  Pertinent labs & imaging results that were  available during my care of the patient were reviewed by me and considered in my medical decision making (see chart for details).  Patient presents for evaluation of right middle finger discomfort.  From my examination and review is the hand it does not appear to be consistent with an acute infectious etiology, there is swelling about the middle phalanx of the right hand but the patient reports this is been chronic for several months time, also reports the tenderness and pain is been present for several months time.  She does however have a note where she went to see orthopedics and they were concerned about possible tenosynovitis based on the examination in the clinic at that time, she was on clindamycin and she reports that there is been no worsening in the pain.    Me my clinical examination seems to argue against an acute infectious etiology, but I suspect much of this pain and swelling is somewhat chronic after her surgery.  My plan is to obtain a CBC, check ESR and CRP for signs of inflammation/infection, I will then discuss with orthopedics for further recommendations  Clinical Course as of May 27 321  Sun May 27, 2017  0308 WBC: 9.0 [MQ]  0308 Sed Rate: 23 [MQ]  0308 Normal white count, sed rate normal, seemingly making infectious etiology very unlikely, especially given very reassuring exam with what the patient describes is very chronic symptoms including edema about the right middle phalanx.  I have paged [facility] orthopedic physician to discuss case again there treatment and follow-up recommendations  [MQ]  0318 Discussed case and care with Dr. Roland Rack, he advises   [MQ]  0320 Dr. Roland Rack reviewed clinical media uploads as well. Advises continue current therapy, follow-up with [facility] this week.   [MQ]    Clinical Course User Index [MQ] Delman Kitten, MD     ____________________________________________   FINAL CLINICAL IMPRESSION(S) / ED DIAGNOSES  Final diagnoses:    Right hand pain      NEW MEDICATIONS STARTED DURING THIS VISIT:  This SmartLink is deprecated. Use AVSMEDLIST instead to display the medication list for a patient.   Note:  This document was prepared using Dragon voice recognition software and may include unintentional dictation errors.     Delman Kitten, MD 05/27/17 [phone]

## 2017-06-15 ENCOUNTER — Other Ambulatory Visit: Payer: Self-pay | Admitting: Internal Medicine

## 2017-06-15 DIAGNOSIS — I208 Other forms of angina pectoris: Secondary | ICD-10-CM

## 2017-06-21 ENCOUNTER — Encounter
Admission: RE | Admit: 2017-06-21 | Discharge: 2017-06-21 | Disposition: A | Discharge status: Home / Self Care | Payer: Medicaid Other | Source: Ambulatory Visit | Attending: Internal Medicine | Admitting: Internal Medicine

## 2017-06-21 DIAGNOSIS — I208 Other forms of angina pectoris: Secondary | ICD-10-CM | POA: Diagnosis not present

## 2017-06-21 LAB — NM MYOCAR MULTI W/SPECT W/WALL MOTION / EF
CHL CUP MPHR: 166 {beats}/min
CHL CUP NUCLEAR SDS: 2
CHL CUP NUCLEAR SRS: 10
CHL CUP NUCLEAR SSS: 7
CSEPED: 1 min
CSEPEW: 1 METS
CSEPPHR: 103 {beats}/min
Exercise duration (sec): 0 s
LV sys vol: 46 mL
LVDIAVOL: 78 mL (ref 46–106)
Percent HR: 62 %
Rest HR: 88 {beats}/min
TID: 0.94

## 2017-06-21 MED ORDER — REGADENOSON 0.4 MG/5ML IV SOLN
0.4000 mg | Freq: Once | INTRAVENOUS | Status: AC
  Administered 2017-06-21: 0.4 mg via INTRAVENOUS

## 2017-06-21 MED ORDER — TECHNETIUM TC 99M TETROFOSMIN IV KIT
30.0000 | PACK | Freq: Once | INTRAVENOUS | Status: AC | PRN
  Administered 2017-06-21: 31.1 via INTRAVENOUS

## 2017-06-21 MED ORDER — TECHNETIUM TC 99M TETROFOSMIN IV KIT
10.0000 | PACK | Freq: Once | INTRAVENOUS | Status: AC | PRN
  Administered 2017-06-21: 13.44 via INTRAVENOUS

## 2017-07-18 ENCOUNTER — Emergency Department: Payer: Medicaid Other

## 2017-07-18 ENCOUNTER — Encounter: Payer: Self-pay | Admitting: Emergency Medicine

## 2017-07-18 ENCOUNTER — Other Ambulatory Visit: Payer: Self-pay

## 2017-07-18 ENCOUNTER — Inpatient Hospital Stay
Admission: EM | Admit: 2017-07-18 | Discharge: 2017-07-20 | DRG: 281 | Disposition: A | Discharge status: Home / Self Care | Payer: Medicaid Other | Attending: Internal Medicine | Admitting: Internal Medicine

## 2017-07-18 DIAGNOSIS — Z888 Allergy status to other drugs, medicaments and biological substances status: Secondary | ICD-10-CM

## 2017-07-18 DIAGNOSIS — I252 Old myocardial infarction: Secondary | ICD-10-CM

## 2017-07-18 DIAGNOSIS — E039 Hypothyroidism, unspecified: Secondary | ICD-10-CM | POA: Diagnosis present

## 2017-07-18 DIAGNOSIS — E876 Hypokalemia: Secondary | ICD-10-CM | POA: Diagnosis not present

## 2017-07-18 DIAGNOSIS — I5032 Chronic diastolic (congestive) heart failure: Secondary | ICD-10-CM | POA: Diagnosis present

## 2017-07-18 DIAGNOSIS — E785 Hyperlipidemia, unspecified: Secondary | ICD-10-CM | POA: Diagnosis present

## 2017-07-18 DIAGNOSIS — Z794 Long term (current) use of insulin: Secondary | ICD-10-CM | POA: Diagnosis not present

## 2017-07-18 DIAGNOSIS — E1165 Type 2 diabetes mellitus with hyperglycemia: Secondary | ICD-10-CM | POA: Diagnosis present

## 2017-07-18 DIAGNOSIS — Z881 Allergy status to other antibiotic agents status: Secondary | ICD-10-CM | POA: Diagnosis not present

## 2017-07-18 DIAGNOSIS — F329 Major depressive disorder, single episode, unspecified: Secondary | ICD-10-CM | POA: Diagnosis present

## 2017-07-18 DIAGNOSIS — I959 Hypotension, unspecified: Secondary | ICD-10-CM | POA: Diagnosis present

## 2017-07-18 DIAGNOSIS — I25110 Atherosclerotic heart disease of native coronary artery with unstable angina pectoris: Secondary | ICD-10-CM | POA: Diagnosis present

## 2017-07-18 DIAGNOSIS — J45909 Unspecified asthma, uncomplicated: Secondary | ICD-10-CM | POA: Diagnosis present

## 2017-07-18 DIAGNOSIS — Z79899 Other long term (current) drug therapy: Secondary | ICD-10-CM

## 2017-07-18 DIAGNOSIS — Z951 Presence of aortocoronary bypass graft: Secondary | ICD-10-CM | POA: Diagnosis not present

## 2017-07-18 DIAGNOSIS — F17200 Nicotine dependence, unspecified, uncomplicated: Secondary | ICD-10-CM | POA: Diagnosis present

## 2017-07-18 DIAGNOSIS — I214 Non-ST elevation (NSTEMI) myocardial infarction: Secondary | ICD-10-CM | POA: Diagnosis present

## 2017-07-18 DIAGNOSIS — F419 Anxiety disorder, unspecified: Secondary | ICD-10-CM | POA: Diagnosis present

## 2017-07-18 DIAGNOSIS — Z955 Presence of coronary angioplasty implant and graft: Secondary | ICD-10-CM | POA: Diagnosis not present

## 2017-07-18 DIAGNOSIS — Z882 Allergy status to sulfonamides status: Secondary | ICD-10-CM

## 2017-07-18 DIAGNOSIS — Z9660 Presence of unspecified orthopedic joint implant: Secondary | ICD-10-CM | POA: Diagnosis present

## 2017-07-18 DIAGNOSIS — Z884 Allergy status to anesthetic agent status: Secondary | ICD-10-CM | POA: Diagnosis not present

## 2017-07-18 DIAGNOSIS — I200 Unstable angina: Secondary | ICD-10-CM

## 2017-07-18 DIAGNOSIS — N179 Acute kidney failure, unspecified: Secondary | ICD-10-CM

## 2017-07-18 DIAGNOSIS — E871 Hypo-osmolality and hyponatremia: Secondary | ICD-10-CM | POA: Diagnosis present

## 2017-07-18 DIAGNOSIS — Z880 Allergy status to penicillin: Secondary | ICD-10-CM

## 2017-07-18 DIAGNOSIS — Z885 Allergy status to narcotic agent status: Secondary | ICD-10-CM

## 2017-07-18 DIAGNOSIS — I110 Hypertensive heart disease with heart failure: Secondary | ICD-10-CM | POA: Diagnosis present

## 2017-07-18 HISTORY — DX: Atherosclerotic heart disease of native coronary artery without angina pectoris: I25.10

## 2017-07-18 LAB — BASIC METABOLIC PANEL
ANION GAP: 10 (ref 5–15)
BUN: 22 mg/dL — ABNORMAL HIGH (ref 6–20)
CALCIUM: 8.7 mg/dL — AB (ref 8.9–10.3)
CO2: 25 mmol/L (ref 22–32)
Chloride: 93 mmol/L — ABNORMAL LOW (ref 101–111)
Creatinine, Ser: 1.38 mg/dL — ABNORMAL HIGH (ref 0.44–1.00)
GFR calc Af Amer: 49 mL/min — ABNORMAL LOW (ref >60)
GFR, EST NON AFRICAN AMERICAN: 42 mL/min — AB (ref >60)
Glucose, Bld: 389 mg/dL — ABNORMAL HIGH (ref 65–99)
POTASSIUM: 3.8 mmol/L (ref 3.5–5.1)
SODIUM: 128 mmol/L — AB (ref 135–145)

## 2017-07-18 LAB — CBC
HEMATOCRIT: 37.5 % (ref 35.0–47.0)
HEMOGLOBIN: 12.9 g/dL (ref 12.0–16.0)
MCH: 27.8 pg (ref 26.0–34.0)
MCHC: 34.4 g/dL (ref 32.0–36.0)
MCV: 80.9 fL (ref 80.0–100.0)
Platelets: 242 10*3/uL (ref 150–440)
RBC: 4.64 MIL/uL (ref 3.80–5.20)
RDW: 14.1 % (ref 11.5–14.5)
WBC: 10.1 10*3/uL (ref 3.6–11.0)

## 2017-07-18 LAB — MRSA PCR SCREENING: MRSA BY PCR: NEGATIVE

## 2017-07-18 LAB — HEPARIN LEVEL (UNFRACTIONATED): HEPARIN UNFRACTIONATED: 0.1 [IU]/mL — AB (ref 0.30–0.70)

## 2017-07-18 LAB — GLUCOSE, CAPILLARY: GLUCOSE-CAPILLARY: 293 mg/dL — AB (ref 65–99)

## 2017-07-18 LAB — TROPONIN I
TROPONIN I: 5.55 ng/mL — AB (ref <0.03)
TROPONIN I: 7.14 ng/mL — AB (ref <0.03)

## 2017-07-18 LAB — PROTIME-INR
INR: 2.38
PROTHROMBIN TIME: 25.8 s — AB (ref 11.4–15.2)

## 2017-07-18 LAB — APTT: APTT: 54 s — AB (ref 24–36)

## 2017-07-18 MED ORDER — ACETAMINOPHEN 325 MG PO TABS: 650.0000 mg | ORAL_TABLET | Freq: Four times a day (QID) | ORAL | Status: DC | PRN

## 2017-07-18 MED ORDER — ASPIRIN EC 81 MG PO TBEC
81.0000 mg | DELAYED_RELEASE_TABLET | Freq: Every day | ORAL | Status: DC
  Administered 2017-07-18 – 2017-07-20 (×3): 81 mg via ORAL
  Filled 2017-07-18 (×3): qty 1

## 2017-07-18 MED ORDER — PROMETHAZINE HCL 25 MG/ML IJ SOLN
12.5000 mg | Freq: Once | INTRAMUSCULAR | Status: AC
  Administered 2017-07-18: 12.5 mg via INTRAVENOUS
  Filled 2017-07-18: qty 1

## 2017-07-18 MED ORDER — MORPHINE SULFATE (PF) 2 MG/ML IV SOLN: 2.0000 mg | INTRAVENOUS | Status: DC | PRN

## 2017-07-18 MED ORDER — SODIUM CHLORIDE 0.9% FLUSH
3.0000 mL | Freq: Two times a day (BID) | INTRAVENOUS | Status: DC
  Administered 2017-07-19 – 2017-07-20 (×2): 3 mL via INTRAVENOUS

## 2017-07-18 MED ORDER — CARVEDILOL PHOSPHATE ER 40 MG PO CP24
40.0000 mg | ORAL_CAPSULE | Freq: Every day | ORAL | Status: DC
  Administered 2017-07-19 – 2017-07-20 (×2): 40 mg via ORAL
  Filled 2017-07-18 (×2): qty 1

## 2017-07-18 MED ORDER — DULOXETINE HCL 30 MG PO CPEP
30.0000 mg | ORAL_CAPSULE | Freq: Every day | ORAL | Status: DC
  Administered 2017-07-19 – 2017-07-20 (×2): 30 mg via ORAL
  Filled 2017-07-18 (×2): qty 1

## 2017-07-18 MED ORDER — POLYETHYLENE GLYCOL 3350 17 G PO PACK: 17.0000 g | PACK | Freq: Every day | ORAL | Status: DC | PRN

## 2017-07-18 MED ORDER — POTASSIUM CHLORIDE CRYS ER 20 MEQ PO TBCR
20.0000 meq | EXTENDED_RELEASE_TABLET | Freq: Every day | ORAL | Status: DC
  Administered 2017-07-19 – 2017-07-20 (×2): 20 meq via ORAL
  Filled 2017-07-18 (×2): qty 1

## 2017-07-18 MED ORDER — ATORVASTATIN CALCIUM 10 MG PO TABS
10.0000 mg | ORAL_TABLET | Freq: Every evening | ORAL | Status: DC
  Administered 2017-07-18 – 2017-07-19 (×2): 10 mg via ORAL
  Filled 2017-07-18 (×2): qty 1

## 2017-07-18 MED ORDER — ISOSORBIDE MONONITRATE ER 30 MG PO TB24
60.0000 mg | ORAL_TABLET | Freq: Every day | ORAL | Status: DC
  Administered 2017-07-19: 60 mg via ORAL
  Filled 2017-07-18: qty 2

## 2017-07-18 MED ORDER — ASPIRIN 81 MG PO CHEW
324.0000 mg | CHEWABLE_TABLET | Freq: Once | ORAL | Status: AC
  Administered 2017-07-18: 324 mg via ORAL
  Filled 2017-07-18: qty 4

## 2017-07-18 MED ORDER — ALBUTEROL SULFATE (2.5 MG/3ML) 0.083% IN NEBU: 2.5000 mg | INHALATION_SOLUTION | RESPIRATORY_TRACT | Status: DC | PRN

## 2017-07-18 MED ORDER — INSULIN DETEMIR 100 UNIT/ML ~~LOC~~ SOLN
70.0000 [IU] | Freq: Every day | SUBCUTANEOUS | Status: DC
  Administered 2017-07-18 – 2017-07-19 (×2): 70 [IU] via SUBCUTANEOUS
  Filled 2017-07-18 (×3): qty 0.7

## 2017-07-18 MED ORDER — CLOPIDOGREL BISULFATE 75 MG PO TABS
75.0000 mg | ORAL_TABLET | Freq: Every day | ORAL | Status: DC
  Administered 2017-07-18 – 2017-07-20 (×3): 75 mg via ORAL
  Filled 2017-07-18 (×3): qty 1

## 2017-07-18 MED ORDER — MORPHINE SULFATE (PF) 2 MG/ML IV SOLN
INTRAVENOUS | Status: AC
  Filled 2017-07-18: qty 1

## 2017-07-18 MED ORDER — IPRATROPIUM-ALBUTEROL 0.5-2.5 (3) MG/3ML IN SOLN
3.0000 mL | Freq: Once | RESPIRATORY_TRACT | Status: AC
  Administered 2017-07-18: 3 mL via RESPIRATORY_TRACT
  Filled 2017-07-18: qty 3

## 2017-07-18 MED ORDER — ACETAMINOPHEN 650 MG RE SUPP: 650.0000 mg | Freq: Four times a day (QID) | RECTAL | Status: DC | PRN

## 2017-07-18 MED ORDER — HEPARIN BOLUS VIA INFUSION
4000.0000 [IU] | Freq: Once | INTRAVENOUS | Status: AC
  Administered 2017-07-18: 4000 [IU] via INTRAVENOUS
  Filled 2017-07-18: qty 4000

## 2017-07-18 MED ORDER — HEPARIN BOLUS VIA INFUSION
2300.0000 [IU] | Freq: Once | INTRAVENOUS | Status: AC
  Administered 2017-07-18: 2300 [IU] via INTRAVENOUS
  Filled 2017-07-18: qty 2300

## 2017-07-18 MED ORDER — LEVOTHYROXINE SODIUM 50 MCG PO TABS
175.0000 ug | ORAL_TABLET | Freq: Every day | ORAL | Status: DC
  Administered 2017-07-19 – 2017-07-20 (×2): 175 ug via ORAL
  Filled 2017-07-18 (×2): qty 2

## 2017-07-18 MED ORDER — HEPARIN (PORCINE) IN NACL 100-0.45 UNIT/ML-% IJ SOLN
1150.0000 [IU]/h | INTRAMUSCULAR | Status: DC
  Administered 2017-07-18: 900 [IU]/h via INTRAVENOUS
  Filled 2017-07-18 (×2): qty 250

## 2017-07-18 MED ORDER — MORPHINE SULFATE (PF) 2 MG/ML IV SOLN
2.0000 mg | Freq: Once | INTRAVENOUS | Status: AC
  Administered 2017-07-18: 2 mg via INTRAVENOUS

## 2017-07-18 MED ORDER — MORPHINE SULFATE (PF) 2 MG/ML IV SOLN
2.0000 mg | INTRAVENOUS | Status: DC | PRN
  Administered 2017-07-18 – 2017-07-20 (×7): 2 mg via INTRAVENOUS
  Filled 2017-07-18 (×8): qty 1

## 2017-07-18 MED ORDER — INSULIN ASPART 100 UNIT/ML ~~LOC~~ SOLN
0.0000 [IU] | Freq: Every day | SUBCUTANEOUS | Status: DC
  Administered 2017-07-18: 3 [IU] via SUBCUTANEOUS
  Filled 2017-07-18 (×2): qty 1

## 2017-07-18 MED ORDER — PANTOPRAZOLE SODIUM 40 MG PO TBEC
40.0000 mg | DELAYED_RELEASE_TABLET | Freq: Every day | ORAL | Status: DC
  Administered 2017-07-19 – 2017-07-20 (×2): 40 mg via ORAL
  Filled 2017-07-18 (×2): qty 1

## 2017-07-18 MED ORDER — PROMETHAZINE HCL 25 MG PO TABS
12.5000 mg | ORAL_TABLET | Freq: Four times a day (QID) | ORAL | Status: DC | PRN
  Administered 2017-07-18 – 2017-07-20 (×3): 12.5 mg via ORAL
  Filled 2017-07-18 (×3): qty 1

## 2017-07-18 MED ORDER — NITROGLYCERIN IN D5W 200-5 MCG/ML-% IV SOLN
0.0000 ug/min | Freq: Once | INTRAVENOUS | Status: AC
  Administered 2017-07-18: 6 ug/min via INTRAVENOUS
  Filled 2017-07-18: qty 250

## 2017-07-18 MED ORDER — INSULIN ASPART 100 UNIT/ML ~~LOC~~ SOLN
0.0000 [IU] | Freq: Three times a day (TID) | SUBCUTANEOUS | Status: DC
  Administered 2017-07-20: 5 [IU] via SUBCUTANEOUS
  Filled 2017-07-18: qty 1

## 2017-07-18 MED ORDER — INSULIN ASPART 100 UNIT/ML ~~LOC~~ SOLN
26.0000 [IU] | Freq: Three times a day (TID) | SUBCUTANEOUS | Status: DC
  Administered 2017-07-20: 26 [IU] via SUBCUTANEOUS
  Filled 2017-07-18: qty 1

## 2017-07-18 MED ORDER — GABAPENTIN 400 MG PO CAPS
800.0000 mg | ORAL_CAPSULE | Freq: Four times a day (QID) | ORAL | Status: DC
  Administered 2017-07-18 – 2017-07-20 (×6): 800 mg via ORAL
  Filled 2017-07-18 (×6): qty 2

## 2017-07-18 NOTE — ED Notes (Signed)
primedoc in with pt for admission.  

## 2017-07-18 NOTE — Progress Notes (Addendum)
ANTICOAGULATION CONSULT NOTE - Initial Consult  Pharmacy Consult for heparin gtt Indication: chest pain/ACS  Allergies  Allergen Reactions  . Clindamycin/Lincomycin Other (See Comments)    Thrush   . Hydrocodone Bitartrate Er Itching  . Lidocaine   . Metformin And Related Other (See Comments)    Fatigue   . Oxycodone Other (See Comments)    Hallucination   . Penicillins Hives  . Pioglitazone Hcl-Glimepiride Hives  . Ramipril Swelling  . Sertraline Hcl Other (See Comments)    Hallucination   . Sitagliptin Other (See Comments)    Headache, chest pain  . Sulfur   . Ondansetron Rash    Patient Measurements: Height: 5\' 7"  (170.2 cm) Weight: 165 lb (74.8 kg) IBW/kg (Calculated) : 61.6 Heparin Dosing Weight: 74.8kg  Vital Signs: Temp: 99.1 F (37.3 C) (02/06 1640) Temp Source: Oral (02/06 1640) BP: 111/67 (02/06 1700) Pulse Rate: 84 (02/06 1715)  Labs: Recent Labs    07/18/17 1641  HGB 12.9  HCT 37.5  PLT 242  CREATININE 1.38*  TROPONINI 5.55*    Estimated Creatinine Clearance: 49.2 mL/min (A) (by C-G formula based on SCr of 1.38 mg/dL (H)).   Medical History: Past Medical History:  Diagnosis Date  . Anemia   . Anxiety   . Asthma   . Depression   . Diabetes mellitus without complication (HCC)   . Diverticulitis   . Heart attack (HCC)   . Hyperlipemia   . Hypertension   . IBS (irritable bowel syndrome)   . Thyroid disease     Medications:   (Not in a hospital admission) Scheduled:  . heparin  4,000 Units Intravenous Once   Infusions:  . heparin    . nitroGLYCERIN     PRN:  Anti-infectives (From admission, onward)   None      Assessment: 57 year old female requiring anticoagulation with heparin gtt for ACS/Chest pain per MD.   Goal of Therapy:  Heparin level 0.3-0.7 units/ml Monitor platelets by anticoagulation protocol: Yes   Plan:  Give 4000 units bolus x 1 Start heparin infusion at 900 units/hr Check anti-Xa level in 6 hours  and daily while on heparin Continue to monitor H&H and platelets  Garrett Coffee 07/18/2017,5:34 PM    2/6 2130 heparin level 0.1. Approx. 3 hour level. Will go ahead and bolus 2300 units and increase rate to 1150 units/hr. Recheck in 6 hours.  Fulton ReekMatt Laymond Postle, PharmD, BCPS  07/18/17 10:58 PM

## 2017-07-18 NOTE — ED Notes (Signed)
2nd iv started and meds infusing.  nsr on monitor.  Pt alert.  Skin warm and dry.  Pt waiting on dr Juliann Parescallwood for consult.

## 2017-07-18 NOTE — ED Notes (Signed)
Pt reports felling weak for 2-3 days.  Pt states feeling worse today.  Pt also reports pain in left chest area   No sob.  Pt states I have no energy.  No n/v/d.

## 2017-07-18 NOTE — ED Notes (Signed)
Dr Juliann Parescallwood and family in with pt now.  nsr on monitor.

## 2017-07-18 NOTE — Consult Note (Signed)
Reason for Consult: Unstable angina possible non-STEMI Referring Physician: Dr. Kerrin Mo primary ER Dr Danne Harbor Cardiologist [facility]  Desiree Carey is an 57 y.o. female.  HPI: Patient presents with abnormal EKG persistent significant chest discomfort over the last several days states that he probably got slightly worse today.  Patient has history of coronary bypass surgery in 2013 at [facility] patient has been on medical therapy since unfortunately she still smokes she stated over the last few days she has had waxing and waning chest discomfort not feeling well nausea weakness fatigue.  Today while with her son she started having midsternal chest discomfort 8 out of 10 no significant radiation of the pain she still felt weak and fatigued once the pain persisted she came to the emergency room for further assessment and evaluation.  Patient recently underwent functional study in the office which showed no significant ischemia with ejection fraction of around 50%.  Patient had been done reasonably well until the last few days did not seek medical attention until today  Past Medical History:  Diagnosis Date  . Anemia   . Anxiety   . Asthma   . Depression   . Diabetes mellitus without complication ([place])   . Diverticulitis   . Heart attack ([place])   . Hyperlipemia   . Hypertension   . IBS (irritable bowel syndrome)   . Thyroid disease     Past Surgical History:  Procedure Laterality Date  . ABDOMINAL HYSTERECTOMY    . CARDIAC SURGERY    . JOINT REPLACEMENT      Family History  Problem Relation Age of Onset  . Diabetes Mother   . Hypertension Mother   . Stroke Mother   . Heart attack Mother     Social History:  reports that she has been smoking.  she has never used smokeless tobacco. She reports that she does not drink alcohol. Her drug history is not on file.  Allergies:  Allergies  Allergen Reactions  . Clindamycin/Lincomycin Other (See Comments)    Thrush   . Hydrocodone  Bitartrate Er Itching  . Lidocaine   . Metformin And Related Other (See Comments)    Fatigue   . Oxycodone Other (See Comments)    Hallucination   . Penicillins Hives  . Pioglitazone Hcl-Glimepiride Hives  . Ramipril Swelling  . Sertraline Hcl Other (See Comments)    Hallucination   . Sitagliptin Other (See Comments)    Headache, chest pain  . Sulfur   . Ondansetron Rash    Medications: I have reviewed the patient's current medications.  Results for orders placed or performed during the hospital encounter of 07/18/17 (from the past 48 hour(s))  Basic metabolic panel     Status: Abnormal   Collection Time: 07/18/17  4:41 PM  Result Value Ref Range   Sodium 128 (L) 135 - 145 mmol/L   Potassium 3.8 3.5 - 5.1 mmol/L   Chloride 93 (L) 101 - 111 mmol/L   CO2 25 22 - 32 mmol/L   Glucose, Bld 389 (H) 65 - 99 mg/dL   BUN 22 (H) 6 - 20 mg/dL   Creatinine, Ser 1.38 (H) 0.44 - 1.00 mg/dL   Calcium 8.7 (L) 8.9 - 10.3 mg/dL   GFR calc non Af Amer 42 (L) >60 mL/min   GFR calc Af Amer 49 (L) >60 mL/min    Comment: (NOTE) The eGFR has been calculated using the CKD EPI equation. This calculation has not been validated in all clinical situations. eGFR's  persistently <60 mL/min signify possible Chronic Kidney Disease.    Anion gap 10 5 - 15    Comment: Performed at [facility], [address] [place], [place] [address]  CBC     Status: None   Collection Time: 07/18/17  4:41 PM  Result Value Ref Range   WBC 10.1 3.6 - 11.0 K/uL   RBC 4.64 3.80 - 5.20 MIL/uL   Hemoglobin 12.9 12.0 - 16.0 g/dL   HCT 37.5 35.0 - 47.0 %   MCV 80.9 80.0 - 100.0 fL   MCH 27.8 26.0 - 34.0 pg   MCHC 34.4 32.0 - 36.0 g/dL   RDW 14.1 11.5 - 14.5 %   Platelets 242 150 - 440 K/uL    Comment: Performed at [facility], [address] [place], [place] [address]  Troponin I     Status: Abnormal   Collection Time: 07/18/17  4:41 PM  Result Value Ref Range   Troponin I 5.55 (HH) <0.03  ng/mL    Comment: CRITICAL RESULT CALLED TO, READ BACK BY AND VERIFIED WITH AMY COHEN 07/18/17 1718 KLW Performed at [facility], [address] [place], [place] [address]   APTT     Status: Abnormal   Collection Time: 07/18/17  5:46 PM  Result Value Ref Range   aPTT 54 (H) 24 - 36 seconds    Comment:        IF BASELINE aPTT IS ELEVATED, SUGGEST PATIENT RISK ASSESSMENT BE USED TO DETERMINE APPROPRIATE ANTICOAGULANT THERAPY. Performed at [facility], [address] [place], [place] [address]   Protime-INR     Status: Abnormal   Collection Time: 07/18/17  5:46 PM  Result Value Ref Range   Prothrombin Time 25.8 (H) 11.4 - 15.2 seconds   INR 2.38     Comment: Performed at [facility], [address] [place], [place] [address]    Dg Chest Portable 1 View  Result Date: 07/18/2017 CLINICAL DATA:  Chest pain, nausea, and overall weakness that started 3 days ago. EXAM: PORTABLE CHEST 1 VIEW COMPARISON:  04/27/2017. FINDINGS: Low lung volumes. Mild cardiac enlargement. Prior median sternotomy for CABG. Mild vascular congestion. No consolidation or edema. Mild scarring LEFT lung base is stable. IMPRESSION: Worsening aeration. Mild cardiac enlargement. Mild vascular congestion without consolidation edema. Electronically Signed   By: Staci Righter M.D.   On: 07/18/2017 17:55    Review of Systems  Constitutional: Positive for diaphoresis and malaise/fatigue.  HENT: Positive for congestion.   Eyes: Negative.   Respiratory: Positive for shortness of breath.   Cardiovascular: Positive for chest pain and palpitations.  Gastrointestinal: Negative.   Genitourinary: Negative.   Musculoskeletal: Negative.   Skin: Negative.   Neurological: Positive for weakness.  Endo/Heme/Allergies: Negative.   Psychiatric/Behavioral: Negative.    Blood pressure 124/73, pulse 81, temperature 99.1 F (37.3 C), temperature source Oral, resp. rate 14, height 5' 7" (1.702 m), weight  165 lb (74.8 kg), SpO2 97 %. Physical Exam  Assessment/Plan: Unstable angina Non-STEMI Abnormal EKG Known coronary disease Hypertension Generalized weakness . Plan Admit to critical care Recommend anticoagulation with heparin intravenously Aspirin Plavix beta-blocker IV nitroglycerin advance as tolerated Correct hypotension with fluid boluses Echocardiogram be helpful for further assessment We will try to treat aggressively medically consider invasive strategy if ischemia is not improved Agree with morphine therapy for pain control Consider Zofran for nausea We will do for emergent catheterization for medical therapy aggressively in ICU for now Follow-up troponins  and EKGs If symptoms do not improve we will consider emergent cardiac cath  Dwayne D Callwood 07/18/2017, 6:27 PM

## 2017-07-18 NOTE — ED Notes (Signed)
Report called to josh rn icu nurse 

## 2017-07-18 NOTE — Consult Note (Signed)
Name: Desiree Carey MRN: [id number] DOB: 1961/04/22    ADMISSION DATE:  07/18/2017 CONSULTATION DATE: 07/18/2017  REFERRING MD : Dr. Elpidio Anis   CHIEF COMPLAINT: Chest Pain   BRIEF PATIENT DESCRIPTION: 57 yo female admitted with unstable angina and NSTEMI requiring nitroglycerin and heparin gtts  SIGNIFICANT EVENTS  02/6-Pt admitted to stepdown unit   STUDIES:  None   HISTORY OF PRESENT ILLNESS:   This is a 57 yo female with a PMH of Hypothyroidism, IBS, HTN, Current Everyday Smoker, Hyperlipidemia, MI, Diverticulitis, Diabetes Mellitus, CAD, Depression, CABG (2013 @[facility] ), Asthma, Anxiety, and Anemia.  She presented to [facility] ER 02/6 with c/o worsening intermittent midsternal chest discomfort for several days with nausea, weakness, and fatigue.  She underwent an outpatient functional study recently which showed no significant ischemia with EF 50%.  In the ER initial EKG revealed nsr, left axis deviation, no ST elevation or depression, however repeat EKG revealed ST depression Cardiology consulted pt did not meet STEMI criteria. Initial troponin 5.5 heparin and nitroglycerin gtts initiated. She was subsequently admitted to the stepdown unit by hospitalist team for further workup and treatment.    PAST MEDICAL HISTORY :   has a past medical history of Anemia, Anxiety, Asthma, CAD (coronary artery disease), Depression, Diabetes mellitus without complication (HCC), Diverticulitis, Heart attack (HCC), Hyperlipemia, Hypertension, IBS (irritable bowel syndrome), and Thyroid disease.  has a past surgical history that includes Joint replacement; Abdominal hysterectomy; and Cardiac surgery. Prior to Admission medications   Medication Sig Start Date End Date Taking? Authorizing Provider  atorvastatin (LIPITOR) 10 MG tablet Take 10 mg by mouth every evening.   Yes [provider]  carvedilol (COREG CR) 40 MG 24 hr capsule Take 40 mg by mouth daily.   Yes [provider]  cloNIDine  (CATAPRES) 0.1 MG tablet Take 0.1 mg by mouth 2 (two) times daily.    Yes [provider]  DULoxetine (CYMBALTA) 30 MG capsule Take 30 mg by mouth daily.    Yes [provider]  gabapentin (NEURONTIN) 800 MG tablet Take 800 mg by mouth 4 (four) times daily.    Yes [provider]  hydrochlorothiazide (HYDRODIURIL) 25 MG tablet Take 25 mg by mouth daily.   Yes [provider]  insulin aspart (NOVOLOG) 100 UNIT/ML injection Inject 32 Units into the skin 3 (three) times daily with meals.    Yes [provider]  insulin detemir (LEVEMIR) 100 unit/ml SOLN Inject 80 Units into the skin at bedtime.   Yes [provider]  isosorbide mononitrate (IMDUR) 60 MG 24 hr tablet Take 60 mg by mouth daily.   Yes [provider]  levothyroxine (SYNTHROID, LEVOTHROID) 175 MCG tablet Take 175 mcg by mouth daily before breakfast.    Yes [provider]  lisinopril (PRINIVIL,ZESTRIL) 2.5 MG tablet Take 2.5 mg by mouth daily.    Yes [provider]  omeprazole (PRILOSEC) 40 MG capsule Take 40 mg by mouth daily.   Yes [provider]  potassium chloride SA (K-DUR,KLOR-CON) 20 MEQ tablet Take 20 mEq by mouth.   Yes [provider]  promethazine (PHENERGAN) 12.5 MG tablet Take 1 tablet (12.5 mg total) every 8 (eight) hours as needed by mouth for nausea or vomiting. 04/28/17  Yes Milagros Loll, MD   Allergies  Allergen Reactions  . Clindamycin/Lincomycin Other (See Comments)    Thrush   . Hydrocodone Bitartrate Er Itching  . Lidocaine   . Metformin And Related Other (See Comments)  Fatigue   . Oxycodone Other (See Comments)    Hallucination   . Penicillins Hives  . Pioglitazone Hcl-Glimepiride Hives  . Ramipril Swelling  . Sertraline Hcl Other (See Comments)    Hallucination   . Sitagliptin Other (See Comments)    Headache, chest pain  . Sulfur   . Ondansetron Rash    FAMILY HISTORY:  family history  includes Diabetes in her mother; Heart attack in her mother; Hypertension in her mother; Stroke in her mother. SOCIAL HISTORY:  reports that she has been smoking.  she has never used smokeless tobacco. She reports that she does not drink alcohol.  REVIEW OF SYSTEMS: Positives in BOLD  Constitutional: fever, chills, weight loss, malaise/fatigue and diaphoresis.  HENT: Negative for hearing loss, ear pain, nosebleeds, congestion, sore throat, neck pain, tinnitus and ear discharge.   Eyes: Negative for blurred vision, double vision, photophobia, pain, discharge and redness.  Respiratory: Negative for cough, hemoptysis, sputum production, shortness of breath, wheezing and stridor.   Cardiovascular: chest pain, palpitations, orthopnea, claudication, leg swelling and PND.  Gastrointestinal: Negative for heartburn, nausea, vomiting, abdominal pain, diarrhea, constipation, blood in stool and melena.  Genitourinary: Negative for dysuria, urgency, frequency, hematuria and flank pain.  Musculoskeletal: Negative for myalgias, back pain, joint pain and falls.  Skin: Negative for itching and rash.  Neurological: dizziness, tingling, tremors, sensory change, speech change, focal weakness, seizures, loss of consciousness, weakness and headaches.  Endo/Heme/Allergies: Negative for environmental allergies and polydipsia. Does not bruise/bleed easily.  SUBJECTIVE:  Pt c/o nausea with mild chest pain   VITAL SIGNS: Temp:  [99.1 F (37.3 C)] 99.1 F (37.3 C) (02/06 1640) Pulse Rate:  [78-86] 78 (02/06 1930) Resp:  [13-22] 22 (02/06 1930) BP: (71-138)/(52-82) 131/76 (02/06 1930) SpO2:  [96 %-99 %] 99 % (02/06 1930) Weight:  [74.8 kg (165 lb)] 74.8 kg (165 lb) (02/06 1638)  PHYSICAL EXAMINATION: General: well developed, well nourished female, NAD  Neuro: alert and oriented, follows commands  HEENT: supple, no JVD  Cardiovascular: nsr, rrr, no M/R/G Lungs: clear throughout, even, non labored  Abdomen: +BS  x4, obese, soft, non tender, non distended  Musculoskeletal: normal bulk and tone, no edema  Skin: no rashes or lesions   Recent Labs  Lab 07/18/17 1641  NA 128*  K 3.8  CL 93*  CO2 25  BUN 22*  CREATININE 1.38*  GLUCOSE 389*   Recent Labs  Lab 07/18/17 1641  HGB 12.9  HCT 37.5  WBC 10.1  PLT 242   Dg Chest Portable 1 View  Result Date: 07/18/2017 CLINICAL DATA:  Chest pain, nausea, and overall weakness that started 3 days ago. EXAM: PORTABLE CHEST 1 VIEW COMPARISON:  04/27/2017. FINDINGS: Low lung volumes. Mild cardiac enlargement. Prior median sternotomy for CABG. Mild vascular congestion. No consolidation or edema. Mild scarring LEFT lung base is stable. IMPRESSION: Worsening aeration. Mild cardiac enlargement. Mild vascular congestion without consolidation edema. Electronically Signed   By: Elsie StainJohn T Curnes M.D.   On: 07/18/2017 17:55    ASSESSMENT / PLAN: Unstable Angina NSTEMI  Hyponatremia  Acute Renal Failure  Hx: CAD, HTN, Current Everyday Smoker, Asthma, and Diabetes Mellitus P: Supplemental O2 for dyspnea and/or hypoxia  Prn bronchodilator therapy Continuous telemetry monitoring  Prn EKG's Cardiology consulted appreciate input-plans for cardiac catheterization in the am  Trend troponin 's Echo pending  Continue heparin gtt Continue aspirin and plavix Trend CBC  Monitor for s/sx of bleeding and transfuse for hgb <7  Prn morphine and  nitroglycerin gtt for chest pain  Prn phenergan for nausea  NS @75  ml/hr Trend BMP Replace electrolytes as indicated Monitor UOP   Sonda Rumble, AGNP  Pulmonary/Critical Care Pager [phone] (please enter 7 digits) PCCM Consult Pager [phone] (please enter 7 digits)

## 2017-07-18 NOTE — H&P (Signed)
[facility] - [place] at [facility]   PATIENT NAME: Desiree Carey    MR#:  [id number]  DATE OF BIRTH:  02-24-1961  DATE OF ADMISSION:  07/18/2017  PRIMARY CARE PHYSICIAN: Lynnea Ferrier, MD   REQUESTING/REFERRING PHYSICIAN: Dr. Don Perking  CHIEF COMPLAINT:   Chief Complaint  Patient presents with  . Chest Pain  . Weakness    HISTORY OF PRESENT ILLNESS:  Desiree Carey  is a 56 y.o. female with a known history of CAD with CABG in 2012, hypertension, anxiety, chronic angina presents to the hospital complaining of chest pain, fatigue and not feeling well.  Here patient has been found to have non-ST elevation MI with troponin of 5.  ST-T wave changes.  Patient is seen by cardiology.  Patient is needing heparin drip and nitroglycerin drip due to ongoing chest pain and will be admitted to ICU in critical condition.  Discussed with the ED physician and cardiology.  PAST MEDICAL HISTORY:   Past Medical History:  Diagnosis Date  . Anemia   . Anxiety   . Asthma   . CAD (coronary artery disease)    CABG 2012  . Depression   . Diabetes mellitus without complication (HCC)   . Diverticulitis   . Heart attack (HCC)   . Hyperlipemia   . Hypertension   . IBS (irritable bowel syndrome)   . Thyroid disease     PAST SURGICAL HISTORY:   Past Surgical History:  Procedure Laterality Date  . ABDOMINAL HYSTERECTOMY    . CARDIAC SURGERY    . JOINT REPLACEMENT      SOCIAL HISTORY:   Social History   Tobacco Use  . Smoking status: Current Every Day Smoker  . Smokeless tobacco: Never Used  Substance Use Topics  . Alcohol use: No    Frequency: Never    FAMILY HISTORY:   Family History  Problem Relation Age of Onset  . Diabetes Mother   . Hypertension Mother   . Stroke Mother   . Heart attack Mother     DRUG ALLERGIES:   Allergies  Allergen Reactions  . Clindamycin/Lincomycin Other (See Comments)    Thrush   . Hydrocodone Bitartrate Er Itching   . Lidocaine   . Metformin And Related Other (See Comments)    Fatigue   . Oxycodone Other (See Comments)    Hallucination   . Penicillins Hives  . Pioglitazone Hcl-Glimepiride Hives  . Ramipril Swelling  . Sertraline Hcl Other (See Comments)    Hallucination   . Sitagliptin Other (See Comments)    Headache, chest pain  . Sulfur   . Ondansetron Rash    REVIEW OF SYSTEMS:   Review of Systems  Constitutional: Positive for malaise/fatigue. Negative for chills and fever.  HENT: Negative for sore throat.   Eyes: Negative for blurred vision, double vision and pain.  Respiratory: Positive for shortness of breath. Negative for cough, hemoptysis and wheezing.   Cardiovascular: Positive for chest pain. Negative for palpitations, orthopnea and leg swelling.  Gastrointestinal: Negative for abdominal pain, constipation, diarrhea, heartburn, nausea and vomiting.  Genitourinary: Negative for dysuria and hematuria.  Musculoskeletal: Negative for back pain and joint pain.  Skin: Negative for rash.  Neurological: Positive for weakness. Negative for sensory change, speech change, focal weakness and headaches.  Endo/Heme/Allergies: Does not bruise/bleed easily.  Psychiatric/Behavioral: Negative for depression. The patient is nervous/anxious.     MEDICATIONS AT HOME:   Prior to Admission medications   Medication Sig Start Date  End Date Taking? Authorizing Provider  atorvastatin (LIPITOR) 10 MG tablet Take 10 mg by mouth every evening.   Yes [provider]  carvedilol (COREG CR) 40 MG 24 hr capsule Take 40 mg by mouth daily.   Yes [provider]  cloNIDine (CATAPRES) 0.1 MG tablet Take 0.1 mg by mouth 2 (two) times daily.    Yes [provider]  DULoxetine (CYMBALTA) 30 MG capsule Take 30 mg by mouth daily.    Yes [provider]  gabapentin (NEURONTIN) 800 MG tablet Take 800 mg by mouth 4 (four) times daily.    Yes [provider]   hydrochlorothiazide (HYDRODIURIL) 25 MG tablet Take 25 mg by mouth daily.   Yes [provider]  insulin aspart (NOVOLOG) 100 UNIT/ML injection Inject 32 Units into the skin 3 (three) times daily with meals.    Yes [provider]  insulin detemir (LEVEMIR) 100 unit/ml SOLN Inject 80 Units into the skin at bedtime.   Yes [provider]  isosorbide mononitrate (IMDUR) 60 MG 24 hr tablet Take 60 mg by mouth daily.   Yes [provider]  levothyroxine (SYNTHROID, LEVOTHROID) 175 MCG tablet Take 175 mcg by mouth daily before breakfast.    Yes [provider]  lisinopril (PRINIVIL,ZESTRIL) 2.5 MG tablet Take 2.5 mg by mouth daily.    Yes [provider]  omeprazole (PRILOSEC) 40 MG capsule Take 40 mg by mouth daily.   Yes [provider]  potassium chloride SA (K-DUR,KLOR-CON) 20 MEQ tablet Take 20 mEq by mouth.   Yes [provider]  promethazine (PHENERGAN) 12.5 MG tablet Take 1 tablet (12.5 mg total) every 8 (eight) hours as needed by mouth for nausea or vomiting. 04/28/17  Yes Deondrae Mcgrail, Wardell HeathSrikar, MD     VITAL SIGNS:  Blood pressure 133/82, pulse 81, temperature 99.1 F (37.3 C), temperature source Oral, resp. rate (!) 21, height 5\' 7"  (1.702 m), weight 74.8 kg (165 lb), SpO2 98 %.  PHYSICAL EXAMINATION:  Physical Exam  GENERAL:  57 y.o.-year-old patient lying in the bed , in distress due to chest pain EYES: Pupils equal, round, reactive to light and accommodation. No scleral icterus. Extraocular muscles intact.  HEENT: Head atraumatic, normocephalic. Oropharynx and nasopharynx clear. No oropharyngeal erythema, moist oral mucosa  NECK:  Supple, no jugular venous distention. No thyroid enlargement, no tenderness.  LUNGS: Normal breath sounds bilaterally, no wheezing, rales, rhonchi. No use of accessory muscles of respiration.  CARDIOVASCULAR: S1, S2 normal. No murmurs, rubs, or gallops.  ABDOMEN: Soft, nontender,  nondistended. Bowel sounds present. No organomegaly or mass.  EXTREMITIES: No pedal edema, cyanosis, or clubbing. + 2 pedal & radial pulses b/l.   NEUROLOGIC: Cranial nerves II through XII are intact. No focal Motor or sensory deficits appreciated b/l PSYCHIATRIC: The patient is alert and oriented x 3.  Anxious and tearful SKIN: No obvious rash, lesion, or ulcer.   LABORATORY PANEL:   CBC Recent Labs  Lab 07/18/17 1641  WBC 10.1  HGB 12.9  HCT 37.5  PLT 242   ------------------------------------------------------------------------------------------------------------------  Chemistries  Recent Labs  Lab 07/18/17 1641  NA 128*  K 3.8  CL 93*  CO2 25  GLUCOSE 389*  BUN 22*  CREATININE 1.38*  CALCIUM 8.7*   ------------------------------------------------------------------------------------------------------------------  Cardiac Enzymes Recent Labs  Lab 07/18/17 1641  TROPONINI 5.55*   ------------------------------------------------------------------------------------------------------------------  RADIOLOGY:  Dg Chest Portable 1 View  Result Date: 07/18/2017 CLINICAL DATA:  Chest pain, nausea, and overall weakness that  started 3 days ago. EXAM: PORTABLE CHEST 1 VIEW COMPARISON:  04/27/2017. FINDINGS: Low lung volumes. Mild cardiac enlargement. Prior median sternotomy for CABG. Mild vascular congestion. No consolidation or edema. Mild scarring LEFT lung base is stable. IMPRESSION: Worsening aeration. Mild cardiac enlargement. Mild vascular congestion without consolidation edema. Electronically Signed   By: Elsie Stain M.D.   On: 07/18/2017 17:55   IMPRESSION AND PLAN:   * NSTEMI Bone and elevated at 5.  Patient has ongoing chest pain.  Discussed with Dr. call Lucretia Roers of cardiology who has seen the patient.  Start heparin drip and nitroglycerin drip for chest pain.  Morphine as needed.  Cardiac catheterization in the morning.  If any worsening emergent cardiac  catheterization.  Patient is critically ill. Check echocardiogram.  Patient is on aspirin, Plavix.  Coreg.  *Chronic diastolic CHF.  No signs of fluid overload.  *Uncontrolled diabetes mellitus with hyperglycemia.  Patient will be on Lantus and pre-meal NovoLog along with sliding scale insulin.  *Hyponatremia.  Pseudohyponatremia due to hyperglycemia.  Should improve as blood sugars improved.  *Hypertension.  Continue home medications.  Hold lisinopril.  *DVT prophylaxis.  On heparin drip.  All the records are reviewed and case discussed with ED provider. Management plans discussed with the patient, family and they are in agreement.  CODE STATUS: FULL CODE  TOTAL TIME TAKING CARE OF THIS PATIENT: 40 minutes.   Molinda Bailiff Staley Budzinski M.D on 07/18/2017 at 6:55 PM  Between 7am to 6pm - Pager - [phone]  After 6pm go to [email or web address] - password [email or web address] [facility]  [facility]  Office  [phone]  CC: Primary care physician; Lynnea Ferrier, MD  Note: This dictation was prepared with Dragon dictation along with smaller phrase technology. Any transcriptional errors that result from this process are unintentional.

## 2017-07-18 NOTE — ED Provider Notes (Addendum)
[facility] Emergency Department Provider Note  ____________________________________________  Time seen: Approximately 5:02 PM  I have reviewed the triage vital signs and the nursing notes.   HISTORY  Chief Complaint Chest Pain and Weakness   HPI Desiree Carey is a 57 y.o. female with a history of CAD s/p CABG, asthma, smoking, diabetes, hypertension, IBS, hypothyroidism who presents for evaluation of generalized weakness and chest pain. Patient reports 3 days of decreased appetite and generalized weakness. She has had a congestion and is coughing clear phlegm. Today she started having tightness in her chest that she describes as constant since this morning, moderate, diffuse across her chest and non radiating. Has had chills but no fever. Has nausea but no vomiting, no diarrhea, no dysuria. She is a smoker. No wheezing. She denies SOB.   Past Medical History:  Diagnosis Date  . Anemia   . Anxiety   . Asthma   . Depression   . Diabetes mellitus without complication (HCC)   . Diverticulitis   . Heart attack (HCC)   . Hyperlipemia   . Hypertension   . IBS (irritable bowel syndrome)   . Thyroid disease     Patient Active Problem List   Diagnosis Date Noted  . Diabetes (HCC) 04/27/2017  . Hypothyroidism 04/27/2017  . CAD (coronary artery disease) 04/27/2017  . Chest pain 04/27/2017  . HTN (hypertension) 04/27/2017  . HLD (hyperlipidemia) 04/27/2017  . AKI (acute kidney injury) (HCC) 04/27/2017    Past Surgical History:  Procedure Laterality Date  . ABDOMINAL HYSTERECTOMY    . CARDIAC SURGERY    . JOINT REPLACEMENT      Prior to Admission medications   Medication Sig Start Date End Date Taking? Authorizing Provider  atorvastatin (LIPITOR) 10 MG tablet Take 10 mg by mouth every evening.   Yes [provider]  carvedilol (COREG CR) 40 MG 24 hr capsule Take 40 mg by mouth daily.   Yes [provider]  cloNIDine (CATAPRES)  0.1 MG tablet Take 0.1 mg by mouth 2 (two) times daily.    Yes [provider]  DULoxetine (CYMBALTA) 30 MG capsule Take 30 mg by mouth daily.    Yes [provider]  gabapentin (NEURONTIN) 800 MG tablet Take 800 mg by mouth 4 (four) times daily.    Yes [provider]  hydrochlorothiazide (HYDRODIURIL) 25 MG tablet Take 25 mg by mouth daily.   Yes [provider]  insulin aspart (NOVOLOG) 100 UNIT/ML injection Inject 32 Units into the skin 3 (three) times daily with meals.    Yes [provider]  insulin detemir (LEVEMIR) 100 unit/ml SOLN Inject 80 Units into the skin at bedtime.   Yes [provider]  isosorbide mononitrate (IMDUR) 60 MG 24 hr tablet Take 60 mg by mouth daily.   Yes [provider]  levothyroxine (SYNTHROID, LEVOTHROID) 175 MCG tablet Take 175 mcg by mouth daily before breakfast.    Yes [provider]  lisinopril (PRINIVIL,ZESTRIL) 2.5 MG tablet Take 2.5 mg by mouth daily.    Yes [provider]  omeprazole (PRILOSEC) 40 MG capsule Take 40 mg by mouth daily.   Yes [provider]  potassium chloride SA (K-DUR,KLOR-CON) 20 MEQ tablet Take 20 mEq by mouth.   Yes [provider]  promethazine (PHENERGAN) 12.5 MG tablet Take 1 tablet (12.5 mg total) every 8 (eight) hours as needed by mouth for nausea or vomiting. 04/28/17  Yes Milagros Loll, MD  Allergies Clindamycin/lincomycin; Hydrocodone bitartrate er; Lidocaine; Metformin and related; Oxycodone; Penicillins; Pioglitazone hcl-glimepiride; Ramipril; Sertraline hcl; Sitagliptin; Sulfur; and Ondansetron  Family History  Problem Relation Age of Onset  . Diabetes Mother   . Hypertension Mother   . Stroke Mother   . Heart attack Mother     Social History Social History   Tobacco Use  . Smoking status: Current Every Day Smoker  . Smokeless tobacco: Never Used  Substance Use Topics  . Alcohol use: No    Frequency: Never    . Drug use: Not on file    Review of Systems  Constitutional: Negative for fever. + Generalized weakness Eyes: Negative for visual changes. ENT: Negative for sore throat. Neck: No neck pain  Cardiovascular: + chest tightness. Respiratory: Negative for shortness of breath. + Cough Gastrointestinal: Negative for abdominal pain, vomiting or diarrhea. + Nausea Genitourinary: Negative for dysuria. Musculoskeletal: Negative for back pain. Skin: Negative for rash. Neurological: Negative for headaches, weakness or numbness. Psych: No SI or HI  ____________________________________________   PHYSICAL EXAM:  VITAL SIGNS: ED Triage Vitals  Enc Vitals Group     BP 07/18/17 1640 (!) 71/52     Pulse Rate 07/18/17 1640 86     Resp 07/18/17 1640 18     Temp 07/18/17 1640 99.1 F (37.3 C)     Temp Source 07/18/17 1640 Oral     SpO2 07/18/17 1640 96 %     Weight 07/18/17 1638 165 lb (74.8 kg)     Height 07/18/17 1638 5\' 7"  (1.702 m)     Head Circumference --      Peak Flow --      Pain Score 07/18/17 1638 9     Pain Loc --      Pain Edu? --      Excl. in GC? --     Constitutional: Alert and oriented. Well appearing and in no apparent distress. HEENT:      Head: Normocephalic and atraumatic.         Eyes: Conjunctivae are normal. Sclera is non-icteric.       Mouth/Throat: Mucous membranes are moist.       Neck: Supple with no signs of meningismus. Cardiovascular: Regular rate and rhythm. No murmurs, gallops, or rubs. 2+ symmetrical distal pulses are present in all extremities. No JVD. Respiratory: Normal respiratory effort. Lungs are clear to auscultation bilaterally with slight diminished air movement. No wheezes, crackles, or rhonchi.  Gastrointestinal: Soft, non tender, and non distended with positive bowel sounds. No rebound or guarding. Musculoskeletal: Nontender with normal range of motion in all extremities. No edema, cyanosis, or erythema of extremities. Neurologic: Normal  speech and language. Face is symmetric. Moving all extremities. No gross focal neurologic deficits are appreciated. Skin: Skin is warm, dry and intact. No rash noted. Psychiatric: Mood and affect are normal. Speech and behavior are normal.  ____________________________________________   LABS (all labs ordered are listed, but only abnormal results are displayed)  Labs Reviewed  BASIC METABOLIC PANEL - Abnormal; Notable for the following components:      Result Value   Sodium 128 (*)    Chloride 93 (*)    Glucose, Bld 389 (*)    BUN 22 (*)    Creatinine, Ser 1.38 (*)    Calcium 8.7 (*)    GFR calc non Af Amer 42 (*)    GFR calc Af Amer 49 (*)    All other components within normal limits  TROPONIN I -  Abnormal; Notable for the following components:   Troponin I 5.55 (*)    All other components within normal limits  APTT - Abnormal; Notable for the following components:   aPTT 54 (*)    All other components within normal limits  PROTIME-INR - Abnormal; Notable for the following components:   Prothrombin Time 25.8 (*)    All other components within normal limits  CBC  HEPARIN LEVEL (UNFRACTIONATED)   ____________________________________________  EKG  ED ECG REPORT I, Nita Sicklearolina Zaylee Cornia, the attending physician, personally viewed and interpreted this ECG.  Normal sinus rhythm, rate of 87, normal intervals, left axis deviation, no ST elevations or depressions. Unchanged from prior  17:30 - NSR, ST depressions in 1, aVL and 2 with a less than 1 mm ST elevation in V1 and 1 mm elevation in aVR. New from initial EKG  18:52 - normal sinus rhythm, rate of 82, first-degree AV block, normal QTC, left axis deviation, no longer seeing elevations in V1 and aVR and improvement of depressions of the remaining leads. ____________________________________________  RADIOLOGY  Interpreted by me: CXR: Mild edema   Interpretation by Radiologist:  Dg Chest Portable 1 View  Result Date:  07/18/2017 CLINICAL DATA:  Chest pain, nausea, and overall weakness that started 3 days ago. EXAM: PORTABLE CHEST 1 VIEW COMPARISON:  04/27/2017. FINDINGS: Low lung volumes. Mild cardiac enlargement. Prior median sternotomy for CABG. Mild vascular congestion. No consolidation or edema. Mild scarring LEFT lung base is stable. IMPRESSION: Worsening aeration. Mild cardiac enlargement. Mild vascular congestion without consolidation edema. Electronically Signed   By: Elsie StainJohn T Curnes M.D.   On: 07/18/2017 17:55      ____________________________________________   PROCEDURES  Procedure(s) performed:yes Procedures   US GUIDED PERIPHERAL IV Indication: Difficult IV access US guided: yes Location: L AC Blood drawback and IV flushed easily Patient tolerated well  Critical Care performed: yes  CRITICAL CARE Performed by: Nita Sicklearolina Lola Lofaro  ?  Total critical care time: 40 min  Critical care time was exclusive of separately billable procedures and treating other patients.  Critical care was necessary to treat or prevent imminent or life-threatening deterioration.  Critical care was time spent personally by me on the following activities: development of treatment plan with patient and/or surrogate as well as nursing, discussions with consultants, evaluation of patient's response to treatment, examination of patient, obtaining history from patient or surrogate, ordering and performing treatments and interventions, ordering and review of laboratory studies, ordering and review of radiographic studies, pulse oximetry and re-evaluation of patient's condition.  ____________________________________________   INITIAL IMPRESSION / ASSESSMENT AND PLAN / ED COURSE  57 y.o. female with a history of CAD s/p CABG, asthma, smoking, diabetes, hypertension, IBS, hypothyroidism who presents for evaluation of generalized weakness, nausea, cough, and chest tightness x 3 days. Patient found to be hypotensive in the  waiting room with pressure 71/52, low-grade temp of 99.1 F which defervesced to 98.6 upon arrival to the room. She has no tachycardia. Her lungs are clear with slightly diminished air movement. She has a history of asthma and is a smoker. We'll give one DuoNeb to see if patient gets relief of her chest tightness. Her EKG shows no ischemic changes, troponin is pending, will give ASA. Chest x-ray is pending to rule out pneumonia. IV fluids have been initiated in the waiting room for hypotension. Labs are pending to rule out dehydration, sepsis.     _________________________ 5:27 PM on 07/18/2017 -----------------------------------------  Troponin 5.55. Patient continues to endorse chest tightness.  BP has improved to low 100s. Will continue IV hydration, start patient on heparin, and nitr gtt. Will repeat EKG.   _________________________ 5:32 PM on 07/18/2017 -----------------------------------------  Repeat EKG showing ST depressions in 1, aVL and 2 with a less than 1 mm ST elevation in V1 and 1 mm elevation in aVR. Does not meet STEMI criteria. Will discuss with cardiology as EKG is changing and patient continues to have pain therefore warrants emergent cardiac evaluation for LHC.    _________________________ 5:39 PM on 07/18/2017 -----------------------------------------  Spoke with Dr. Juliann Pares who agrees patient does not meet STEMI criteria and is coming to the ED to evaluate her. Has recommended not activating the cath lab at this time.  _________________________ 6:36 PM on 07/18/2017 -----------------------------------------  Dr. Juliann Pares has evaluated patient and wants to treat her medically at this point. Patient on heparin and nitro. Patient remains stable. She will be admitted to the Hospitalist service.   As part of my medical decision making, I reviewed the following data within the electronic MEDICAL RECORD NUMBER Nursing notes reviewed and incorporated, Labs reviewed , EKG  interpreted , Old EKG reviewed, Old chart reviewed, Radiograph reviewed , Discussed with admitting physician , A consult was requested and obtained from this/these consultant(s) Cardiology, Notes from prior ED visits and [place] Controlled Substance Database    Pertinent labs & imaging results that were available during my care of the patient were reviewed by me and considered in my medical decision making (see chart for details).    ____________________________________________   FINAL CLINICAL IMPRESSION(S) / ED DIAGNOSES  Final diagnoses:  NSTEMI (non-ST elevated myocardial infarction) (HCC)      NEW MEDICATIONS STARTED DURING THIS VISIT:  ED Discharge Orders    None       Note:  This document was prepared using Dragon voice recognition software and may include unintentional dictation errors.    Don Perking, [place], MD 07/18/17 Ovidio Kin    Don Perking, [place], MD 07/18/17 [phone]

## 2017-07-18 NOTE — ED Triage Notes (Signed)
Pt comes into the ED via POV c/o chest pain, nausea, and overall weakness that started on Sunday.  Patient denies being around anyone that has been sick.  Patient states she has been spitting up phlegm but denies a cough associated with it.  Patient in NAD with even and unlabored respirations and this time and with warm, dry skin.  DEnies any cardiac history.

## 2017-07-19 ENCOUNTER — Encounter: Payer: Self-pay | Admitting: *Deleted

## 2017-07-19 ENCOUNTER — Encounter
Admission: EM | Disposition: A | Discharge status: Home / Self Care | Payer: Self-pay | Source: Home / Self Care | Attending: Internal Medicine

## 2017-07-19 DIAGNOSIS — N179 Acute kidney failure, unspecified: Secondary | ICD-10-CM

## 2017-07-19 DIAGNOSIS — I200 Unstable angina: Secondary | ICD-10-CM

## 2017-07-19 HISTORY — PX: LEFT HEART CATH AND CORONARY ANGIOGRAPHY: CATH118249

## 2017-07-19 LAB — TROPONIN I: TROPONIN I: 7.76 ng/mL — AB (ref <0.03)

## 2017-07-19 LAB — GLUCOSE, CAPILLARY
GLUCOSE-CAPILLARY: 291 mg/dL — AB (ref 65–99)
GLUCOSE-CAPILLARY: 123 mg/dL — AB (ref 65–99)
GLUCOSE-CAPILLARY: 133 mg/dL — AB (ref 65–99)
GLUCOSE-CAPILLARY: 74 mg/dL (ref 65–99)
GLUCOSE-CAPILLARY: 82 mg/dL (ref 65–99)
GLUCOSE-CAPILLARY: 60 mg/dL — AB (ref 65–99)
GLUCOSE-CAPILLARY: 79 mg/dL (ref 65–99)
GLUCOSE-CAPILLARY: 183 mg/dL — AB (ref 65–99)

## 2017-07-19 LAB — BASIC METABOLIC PANEL
Anion gap: 8 (ref 5–15)
BUN: 15 mg/dL (ref 6–20)
CO2: 24 mmol/L (ref 22–32)
CREATININE: 0.9 mg/dL (ref 0.44–1.00)
Calcium: 8.2 mg/dL — ABNORMAL LOW (ref 8.9–10.3)
Chloride: 104 mmol/L (ref 101–111)
Glucose, Bld: 164 mg/dL — ABNORMAL HIGH (ref 65–99)
POTASSIUM: 3.2 mmol/L — AB (ref 3.5–5.1)
SODIUM: 136 mmol/L (ref 135–145)

## 2017-07-19 LAB — CBC
HCT: 36.1 % (ref 35.0–47.0)
Hemoglobin: 12.6 g/dL (ref 12.0–16.0)
MCH: 28.1 pg (ref 26.0–34.0)
MCHC: 35 g/dL (ref 32.0–36.0)
MCV: 80.3 fL (ref 80.0–100.0)
PLATELETS: 202 10*3/uL (ref 150–440)
RBC: 4.49 MIL/uL (ref 3.80–5.20)
RDW: 14 % (ref 11.5–14.5)
WBC: 10.6 10*3/uL (ref 3.6–11.0)

## 2017-07-19 LAB — HEMOGLOBIN A1C
HEMOGLOBIN A1C: 10.7 % — AB (ref 4.8–5.6)
Mean Plasma Glucose: 260.39 mg/dL

## 2017-07-19 LAB — HEPARIN LEVEL (UNFRACTIONATED): HEPARIN UNFRACTIONATED: 1 [IU]/mL — AB (ref 0.30–0.70)

## 2017-07-19 SURGERY — LEFT HEART CATH AND CORONARY ANGIOGRAPHY
Anesthesia: Moderate Sedation

## 2017-07-19 MED ORDER — SODIUM CHLORIDE 0.9% FLUSH: 3.0000 mL | Freq: Two times a day (BID) | INTRAVENOUS | Status: DC

## 2017-07-19 MED ORDER — HEPARIN (PORCINE) IN NACL 2-0.9 UNIT/ML-% IJ SOLN
INTRAMUSCULAR | Status: AC
  Filled 2017-07-19: qty 1000

## 2017-07-19 MED ORDER — MIDAZOLAM HCL 2 MG/2ML IJ SOLN
INTRAMUSCULAR | Status: AC
  Filled 2017-07-19: qty 2

## 2017-07-19 MED ORDER — SODIUM CHLORIDE 0.9% FLUSH
3.0000 mL | Freq: Two times a day (BID) | INTRAVENOUS | Status: DC
  Administered 2017-07-20: 3 mL via INTRAVENOUS

## 2017-07-19 MED ORDER — IOPAMIDOL (ISOVUE-300) INJECTION 61%
INTRAVENOUS | Status: DC | PRN
  Administered 2017-07-19: 130 mL via INTRA_ARTERIAL

## 2017-07-19 MED ORDER — SODIUM CHLORIDE 0.9% FLUSH: 3.0000 mL | INTRAVENOUS | Status: DC | PRN

## 2017-07-19 MED ORDER — SODIUM CHLORIDE 0.9 % WEIGHT BASED INFUSION: 3.0000 mL/kg/h | INTRAVENOUS | Status: AC

## 2017-07-19 MED ORDER — BUPIVACAINE HCL (PF) 0.5 % IJ SOLN
INTRAMUSCULAR | Status: DC | PRN
  Administered 2017-07-19: 30 mL

## 2017-07-19 MED ORDER — FENTANYL CITRATE (PF) 100 MCG/2ML IJ SOLN
INTRAMUSCULAR | Status: AC
  Filled 2017-07-19: qty 2

## 2017-07-19 MED ORDER — ONDANSETRON HCL 4 MG/2ML IJ SOLN
4.0000 mg | Freq: Four times a day (QID) | INTRAMUSCULAR | Status: DC | PRN
  Filled 2017-07-19: qty 2

## 2017-07-19 MED ORDER — SODIUM CHLORIDE 0.9 % WEIGHT BASED INFUSION: 1.0000 mL/kg/h | INTRAVENOUS | Status: DC

## 2017-07-19 MED ORDER — SODIUM CHLORIDE 0.9 % IV SOLN: 250.0000 mL | INTRAVENOUS | Status: DC | PRN

## 2017-07-19 MED ORDER — SODIUM CHLORIDE 0.9 % WEIGHT BASED INFUSION
1.0000 mL/kg/h | INTRAVENOUS | Status: DC
  Administered 2017-07-19: 1 mL/kg/h via INTRAVENOUS

## 2017-07-19 MED ORDER — FENTANYL CITRATE (PF) 100 MCG/2ML IJ SOLN
INTRAMUSCULAR | Status: DC | PRN
  Administered 2017-07-19: 25 ug via INTRAVENOUS

## 2017-07-19 MED ORDER — MIDAZOLAM HCL 2 MG/2ML IJ SOLN
INTRAMUSCULAR | Status: DC | PRN
  Administered 2017-07-19: 0.5 mg via INTRAVENOUS

## 2017-07-19 MED ORDER — BACITRACIN-NEOMYCIN-POLYMYXIN 400-5-5000 EX OINT
TOPICAL_OINTMENT | Freq: Every day | CUTANEOUS | Status: DC | PRN
  Filled 2017-07-19: qty 1

## 2017-07-19 MED ORDER — BUPIVACAINE HCL (PF) 0.5 % IJ SOLN
INTRAMUSCULAR | Status: AC
  Filled 2017-07-19: qty 30

## 2017-07-19 MED ORDER — HEPARIN (PORCINE) IN NACL 100-0.45 UNIT/ML-% IJ SOLN: 900.0000 [IU]/h | INTRAMUSCULAR | Status: DC

## 2017-07-19 MED ORDER — ISOSORBIDE MONONITRATE ER 30 MG PO TB24
120.0000 mg | ORAL_TABLET | Freq: Every day | ORAL | Status: DC
  Administered 2017-07-20: 120 mg via ORAL
  Filled 2017-07-19: qty 4

## 2017-07-19 MED ORDER — ASPIRIN 81 MG PO CHEW: 81.0000 mg | CHEWABLE_TABLET | ORAL | Status: DC

## 2017-07-19 MED ORDER — RANOLAZINE ER 500 MG PO TB12
500.0000 mg | ORAL_TABLET | Freq: Two times a day (BID) | ORAL | Status: DC
  Administered 2017-07-19 – 2017-07-20 (×2): 500 mg via ORAL
  Filled 2017-07-19 (×3): qty 1

## 2017-07-19 MED ORDER — ACETAMINOPHEN 325 MG PO TABS: 650.0000 mg | ORAL_TABLET | ORAL | Status: DC | PRN

## 2017-07-19 SURGICAL SUPPLY — 9 items
CATH INFINITI 5FR ANG PIGTAIL (CATHETERS) ×3 IMPLANT
CATH INFINITI 5FR JL4 (CATHETERS) ×3 IMPLANT
CATH INFINITI JR4 5F (CATHETERS) ×3 IMPLANT
DEVICE CLOSURE MYNXGRIP 5F (Vascular Products) ×3 IMPLANT
KIT MANI 3VAL PERCEP (MISCELLANEOUS) ×3 IMPLANT
NEEDLE PERC 18GX7CM (NEEDLE) ×3 IMPLANT
PACK CARDIAC CATH (CUSTOM PROCEDURE TRAY) ×3 IMPLANT
SHEATH AVANTI 5FR X 11CM (SHEATH) ×3 IMPLANT
WIRE GUIDERIGHT .035X150 (WIRE) ×3 IMPLANT

## 2017-07-19 NOTE — Progress Notes (Signed)
   07/19/17 1900  Clinical Encounter Type  Visited With Patient not available  Visit Type Follow-up   Chaplain attempted visit; patient appeared to be sleeping, she did not respond to chaplain's knock or voice.

## 2017-07-19 NOTE — Progress Notes (Signed)
   07/19/17 1045  Clinical Encounter Type  Visited With Patient not available  Visit Type Initial  Referral From Nurse  Consult/Referral To Chaplain  Spiritual Encounters  Spiritual Needs Prayer   Chaplain responded to order for prayer with patient.  Patient sleeping, chaplain offered silent prayer.  Chaplain to visit again today to try to engage patient.

## 2017-07-19 NOTE — Progress Notes (Signed)
RN notified Dr Juliann Paresallwood to clarify order as patient is NPO now, MD states patient can take all of her med with sip of water.  Patient can have clear liquid diet for breakfast and then nothing.  If fsbs is <150, do not give any insulin coverage, if 150 or above, give 1/2 of what sliding scale call for.

## 2017-07-19 NOTE — Progress Notes (Signed)
   07/19/17 1845  Clinical Encounter Type  Visited With Patient  Visit Type Follow-up   Chaplain attempted visit; patient on a call, she asked chaplain to return later this evening

## 2017-07-19 NOTE — Progress Notes (Addendum)
Inpatient Diabetes Program Recommendations  AACE/ADA: New Consensus Statement on Inpatient Glycemic Control (2015)  Target Ranges:  Prepandial:   less than 140 mg/dL      Peak postprandial:   less than 180 mg/dL (1-2 hours)      Critically ill patients:  140 - 180 mg/dL   Lab Results  Component Value Date   GLUCAP 123 (H) 07/19/2017    Review of Glycemic Control  Results for [name], Desiree (MRN [id number]) as of 07/19/2017 09:49  Ref. Range 07/18/2017 20:54 07/18/2017 23:30 07/19/2017 07:27  Glucose-Capillary Latest Ref Range: 65 - 99 mg/dL 409291 (H) 811293 (H) 914123 (H)    Diabetes history: Type 2 Outpatient Diabetes medications: Novolog 32 units tid with meals, Levemir 80 units qhs  Current orders for Inpatient glycemic control: Novolog 0-15 units tid, Novolog 0-5 units qhs, Novolog 26 units tid, Levemir 70 units qhs  Inpatient Diabetes Program Recommendations: Agree with current medications for blood sugar management.   ** Please get an order to hold Novolog 26 units if patient eats less than 50%  Susette RacerJulie Hisae Decoursey, RN, [place], [place], CDE Diabetes Coordinator Inpatient Diabetes Program  [phone] (Team Pager) [phone] [facility] Office) 07/19/2017 9:57 AM

## 2017-07-19 NOTE — Progress Notes (Signed)
Dr Allena KatzPatel in room to see patient as patient is leaving to cardiac cath, per Dr Enedina FinnerSona Patel, RN does not to go with patient for procedure.

## 2017-07-19 NOTE — Progress Notes (Signed)
   07/19/17 1555  Clinical Encounter Type  Visited With Patient not available  Visit Type Follow-up   Chaplain checked chart prior to follow up with patient; learned that patient is in the Cath Lab.  Chaplain offered silent prayer, will attempt to check in with patient this evening.

## 2017-07-19 NOTE — Progress Notes (Signed)
[facility] [place] at [facility]   PATIENT NAME: Desiree Carey    MR#:  [id number]  DATE OF BIRTH:  August 26, 1960  SUBJECTIVE:  Patient came in with increasing chest pain.  She was found to have elevated troponin with ST T changes on EKG.  She is currently on heparin drip.  Denies any chest pain.  REVIEW OF SYSTEMS:   Review of Systems  Constitutional: Negative for chills, fever and weight loss.  HENT: Negative for ear discharge, ear pain and nosebleeds.   Eyes: Negative for blurred vision, pain and discharge.  Respiratory: Negative for sputum production, shortness of breath, wheezing and stridor.   Cardiovascular: Negative for chest pain, palpitations, orthopnea and PND.  Gastrointestinal: Negative for abdominal pain, diarrhea, nausea and vomiting.  Genitourinary: Negative for frequency and urgency.  Musculoskeletal: Negative for back pain and joint pain.  Neurological: Positive for weakness. Negative for sensory change, speech change and focal weakness.  Psychiatric/Behavioral: Negative for depression and hallucinations. The patient is not nervous/anxious.    Tolerating Diet: N.p.o. Tolerating PT: Ambulatory  DRUG ALLERGIES:   Allergies  Allergen Reactions  . Clindamycin/Lincomycin Other (See Comments)    Thrush   . Hydrocodone Bitartrate Er Itching  . Lidocaine   . Metformin And Related Other (See Comments)    Fatigue   . Oxycodone Other (See Comments)    Hallucination   . Penicillins Hives  . Pioglitazone Hcl-Glimepiride Hives  . Ramipril Swelling  . Sertraline Hcl Other (See Comments)    Hallucination   . Sitagliptin Other (See Comments)    Headache, chest pain  . Sulfur   . Ondansetron Rash    VITALS:  Blood pressure 118/72, pulse 74, temperature 98 F (36.7 C), temperature source Oral, resp. rate 17, height 5\' 7"  (1.702 m), weight 78.7 kg (173 lb 8 oz), SpO2 99 %.  PHYSICAL EXAMINATION:   Physical Exam  GENERAL:  57  y.o.-year-old patient lying in the bed with no acute distress.  Obese EYES: Pupils equal, round, reactive to light and accommodation. No scleral icterus. Extraocular muscles intact.  HEENT: Head atraumatic, normocephalic. Oropharynx and nasopharynx clear.  NECK:  Supple, no jugular venous distention. No thyroid enlargement, no tenderness.  LUNGS: Normal breath sounds bilaterally, no wheezing, rales, rhonchi. No use of accessory muscles of respiration.  CARDIOVASCULAR: S1, S2 normal. No murmurs, rubs, or gallops.  ABDOMEN: Soft, nontender, nondistended. Bowel sounds present. No organomegaly or mass.  EXTREMITIES: No cyanosis, clubbing or edema b/l.    NEUROLOGIC: Cranial nerves II through XII are intact. No focal Motor or sensory deficits b/l.   PSYCHIATRIC:  patient is alert and oriented x 3.  SKIN: No obvious rash, lesion, or ulcer.   LABORATORY PANEL:  CBC Recent Labs  Lab 07/19/17 0448  WBC 10.6  HGB 12.6  HCT 36.1  PLT 202    Chemistries  Recent Labs  Lab 07/19/17 0448  NA 136  K 3.2*  CL 104  CO2 24  GLUCOSE 164*  BUN 15  CREATININE 0.90  CALCIUM 8.2*   Cardiac Enzymes Recent Labs  Lab 07/19/17 0448  TROPONINI 7.76*   RADIOLOGY:  Dg Chest Portable 1 View  Result Date: 07/18/2017 CLINICAL DATA:  Chest pain, nausea, and overall weakness that started 3 days ago. EXAM: PORTABLE CHEST 1 VIEW COMPARISON:  04/27/2017. FINDINGS: Low lung volumes. Mild cardiac enlargement. Prior median sternotomy for CABG. Mild vascular congestion. No consolidation or edema. Mild scarring LEFT lung base is stable. IMPRESSION: Worsening  aeration. Mild cardiac enlargement. Mild vascular congestion without consolidation edema. Electronically Signed   By: Elsie StainJohn T Curnes M.D.   On: 07/18/2017 17:55   ASSESSMENT AND PLAN:  Desiree Carey  is a 57 y.o. female with a known history of CAD with CABG in 2012, hypertension, anxiety, chronic angina presents to the hospital complaining of chest pain,  fatigue and not feeling well.  Here patient has been found to have non-ST elevation MI with troponin of 5.  ST-T wave changes  *Acute NSTEMI -Troponin elevated at 5--- 7.76 --On IV heparin drip and nitroglycerin drip for chest pain.  Morphine as needed.   -Status post cardiac catheterization --- results pending -Patient is on aspirin, Plavix.  Coreg.  *Chronic diastolic CHF.  No signs of fluid overload.  *Uncontrolled diabetes mellitus with hyperglycemia.   -Patient will be on Lantus and pre-meal NovoLog along with sliding scale insulin.  *Hyponatremia.  - Pseudohyponatremia due to hyperglycemia.  Should improve as blood sugars improved.  *Hypertension.  Continue home medications.  Hold lisinopril.  *DVT prophylaxis.  On heparin drip.  Follow further cardiology recommendations.   Case discussed with Care Management/Social Worker. Management plans discussed with the patient, family and they are in agreement.  CODE STATUS: Full  DVT Prophylaxis: Heparin drip  TOTAL TIME TAKING CARE OF THIS PATIENT: *30* minutes.  >50% time spent on counselling and coordination of care  POSSIBLE D/C IN *1-2* DAYS, DEPENDING ON CLINICAL CONDITION.  Note: This dictation was prepared with Dragon dictation along with smaller phrase technology. Any transcriptional errors that result from this process are unintentional.  Enedina FinnerSona Gerardo Caiazzo M.D on 07/19/2017 at 5:23 PM  Between 7am to 6pm - Pager - [phone]  After 6pm go to [email or web address] - password [email or web address]  [facility]  Office  [phone]  CC: Primary care physician; Lynnea FerrierKlein, Bert J III, MDPatient ID: Desiree Carey, female   DOB: 06/28/1960, 57 y.o.   MRN: [id number]

## 2017-07-19 NOTE — Progress Notes (Addendum)
ANTICOAGULATION CONSULT NOTE - Initial Consult  Pharmacy Consult for heparin gtt Indication: chest pain/ACS  Allergies  Allergen Reactions  . Clindamycin/Lincomycin Other (See Comments)    Thrush   . Hydrocodone Bitartrate Er Itching  . Lidocaine   . Metformin And Related Other (See Comments)    Fatigue   . Oxycodone Other (See Comments)    Hallucination   . Penicillins Hives  . Pioglitazone Hcl-Glimepiride Hives  . Ramipril Swelling  . Sertraline Hcl Other (See Comments)    Hallucination   . Sitagliptin Other (See Comments)    Headache, chest pain  . Sulfur   . Ondansetron Rash    Patient Measurements: Height: 5\' 7"  (170.2 cm) Weight: 165 lb (74.8 kg) IBW/kg (Calculated) : 61.6 Heparin Dosing Weight: 74.8kg  Vital Signs: Temp: 98 F (36.7 C) (02/07 0400) Temp Source: Oral (02/07 0400) BP: 115/79 (02/07 0600) Pulse Rate: 80 (02/07 0600)  Labs: Recent Labs    07/18/17 1641 07/18/17 1746 07/18/17 2119 07/19/17 0448  HGB 12.9  --   --  12.6  HCT 37.5  --   --  36.1  PLT 242  --   --  202  APTT  --  54*  --   --   LABPROT  --  25.8*  --   --   INR  --  2.38  --   --   HEPARINUNFRC  --   --  0.10* 1.00*  CREATININE 1.38*  --   --   --   TROPONINI 5.55*  --  7.14* 7.76*    Estimated Creatinine Clearance: 49.2 mL/min (A) (by C-G formula based on SCr of 1.38 mg/dL (H)).   Medical History: Past Medical History:  Diagnosis Date  . Anemia   . Anxiety   . Asthma   . CAD (coronary artery disease)    CABG 2012  . Depression   . Diabetes mellitus without complication (HCC)   . Diverticulitis   . Heart attack (HCC)   . Hyperlipemia   . Hypertension   . IBS (irritable bowel syndrome)   . Thyroid disease     Medications:  Medications Prior to Admission  Medication Sig Dispense Refill Last Dose  . atorvastatin (LIPITOR) 10 MG tablet Take 10 mg by mouth every evening.   07/17/2017 at PM  . carvedilol (COREG CR) 40 MG 24 hr capsule Take 40 mg by mouth  daily.   07/18/2017 at AM  . cloNIDine (CATAPRES) 0.1 MG tablet Take 0.1 mg by mouth 2 (two) times daily.    07/18/2017 at AM  . DULoxetine (CYMBALTA) 30 MG capsule Take 30 mg by mouth daily.    07/18/2017 at AM  . gabapentin (NEURONTIN) 800 MG tablet Take 800 mg by mouth 4 (four) times daily.    07/18/2017 at AM  . hydrochlorothiazide (HYDRODIURIL) 25 MG tablet Take 25 mg by mouth daily.   N/A at N/A  . insulin aspart (NOVOLOG) 100 UNIT/ML injection Inject 32 Units into the skin 3 (three) times daily with meals.    N/A at N/A  . insulin detemir (LEVEMIR) 100 unit/ml SOLN Inject 80 Units into the skin at bedtime.   07/17/2017 at PM  . isosorbide mononitrate (IMDUR) 60 MG 24 hr tablet Take 60 mg by mouth daily.   07/18/2017 at AM  . levothyroxine (SYNTHROID, LEVOTHROID) 175 MCG tablet Take 175 mcg by mouth daily before breakfast.    07/18/2017 at AM  . lisinopril (PRINIVIL,ZESTRIL) 2.5 MG tablet Take 2.5 mg  by mouth daily.    07/18/2017 at AM  . omeprazole (PRILOSEC) 40 MG capsule Take 40 mg by mouth daily.   07/18/2017 at AM  . potassium chloride SA (K-DUR,KLOR-CON) 20 MEQ tablet Take 20 mEq by mouth.   07/18/2017 at AM  . promethazine (PHENERGAN) 12.5 MG tablet Take 1 tablet (12.5 mg total) every 8 (eight) hours as needed by mouth for nausea or vomiting. 20 tablet 0 PRN at PRN   Scheduled:  . aspirin EC  81 mg Oral Daily  . atorvastatin  10 mg Oral QPM  . carvedilol  40 mg Oral Daily  . clopidogrel  75 mg Oral Daily  . DULoxetine  30 mg Oral Daily  . gabapentin  800 mg Oral QID  . insulin aspart  0-15 Units Subcutaneous TID WC  . insulin aspart  0-5 Units Subcutaneous QHS  . insulin aspart  26 Units Subcutaneous TID WC  . insulin detemir  70 Units Subcutaneous QHS  . isosorbide mononitrate  60 mg Oral Daily  . levothyroxine  175 mcg Oral QAC breakfast  . pantoprazole  40 mg Oral Daily  . potassium chloride SA  20 mEq Oral Daily  . sodium chloride flush  3 mL Intravenous Q12H   Infusions:  . heparin  1,150 Units/hr (07/19/17 0500)   PRN:  Anti-infectives (From admission, onward)   None      Assessment: 57 year old female requiring anticoagulation with heparin gtt for ACS/Chest pain per MD.   Goal of Therapy:  Heparin level 0.3-0.7 units/ml Monitor platelets by anticoagulation protocol: Yes   Plan:  Give 4000 units bolus x 1 Start heparin infusion at 900 units/hr Check anti-Xa level in 6 hours and daily while on heparin Continue to monitor H&H and platelets    2/6 2130 heparin level 0.1. Approx. 3 hour level. Will go ahead and bolus 2300 units and increase rate to 1150 units/hr. Recheck in 6 hours.  2/7 AM heparin level 1. Hold drip x 1 hour and restart at 900 units/hr. Recheck 6 hours after restart.  Fulton Reek, PharmD, BCPS  07/19/17 6:41 AM

## 2017-07-20 ENCOUNTER — Encounter: Payer: Self-pay | Admitting: Internal Medicine

## 2017-07-20 LAB — MAGNESIUM: Magnesium: 1.6 mg/dL — ABNORMAL LOW (ref 1.7–2.4)

## 2017-07-20 LAB — GLUCOSE, CAPILLARY
GLUCOSE-CAPILLARY: 84 mg/dL (ref 65–99)
Glucose-Capillary: 219 mg/dL — ABNORMAL HIGH (ref 65–99)
Glucose-Capillary: 40 mg/dL — CL (ref 65–99)
Glucose-Capillary: 77 mg/dL (ref 65–99)
Glucose-Capillary: 84 mg/dL (ref 65–99)

## 2017-07-20 LAB — BASIC METABOLIC PANEL
Anion gap: 7 (ref 5–15)
BUN: 8 mg/dL (ref 6–20)
CHLORIDE: 106 mmol/L (ref 101–111)
CO2: 25 mmol/L (ref 22–32)
CREATININE: 0.87 mg/dL (ref 0.44–1.00)
Calcium: 8.3 mg/dL — ABNORMAL LOW (ref 8.9–10.3)
GFR calc non Af Amer: >60 mL/min (ref >60)
Glucose, Bld: 126 mg/dL — ABNORMAL HIGH (ref 65–99)
Potassium: 3.3 mmol/L — ABNORMAL LOW (ref 3.5–5.1)
Sodium: 138 mmol/L (ref 135–145)

## 2017-07-20 MED ORDER — MAGNESIUM SULFATE 4 GM/100ML IV SOLN
4.0000 g | Freq: Once | INTRAVENOUS | Status: AC
  Administered 2017-07-20: 4 g via INTRAVENOUS
  Filled 2017-07-20: qty 100

## 2017-07-20 MED ORDER — INSULIN DETEMIR 100 UNIT/ML ~~LOC~~ SOLN
60.0000 [IU] | Freq: Every day | SUBCUTANEOUS | Status: DC
  Filled 2017-07-20: qty 0.6

## 2017-07-20 MED ORDER — POTASSIUM CHLORIDE CRYS ER 20 MEQ PO TBCR
40.0000 meq | EXTENDED_RELEASE_TABLET | Freq: Once | ORAL | Status: AC
  Administered 2017-07-20: 40 meq via ORAL
  Filled 2017-07-20: qty 2

## 2017-07-20 MED ORDER — ISOSORBIDE MONONITRATE ER 120 MG PO TB24: 120.0000 mg | ORAL_TABLET | Freq: Every day | ORAL | 1 refills | Status: DC

## 2017-07-20 MED ORDER — PROMETHAZINE HCL 12.5 MG PO TABS: 12.5000 mg | ORAL_TABLET | Freq: Three times a day (TID) | ORAL | 0 refills | Status: AC | PRN

## 2017-07-20 MED ORDER — INSULIN ASPART 100 UNIT/ML ~~LOC~~ SOLN
10.0000 [IU] | Freq: Three times a day (TID) | SUBCUTANEOUS | Status: DC
Start: 2017-07-20 — End: 2017-07-20
  Administered 2017-07-20: 10 [IU] via SUBCUTANEOUS
  Filled 2017-07-20: qty 1

## 2017-07-20 MED ORDER — ASPIRIN 81 MG PO TBEC: 81.0000 mg | DELAYED_RELEASE_TABLET | Freq: Every day | ORAL | 1 refills | Status: AC

## 2017-07-20 MED ORDER — RANOLAZINE ER 500 MG PO TB12: 500.0000 mg | ORAL_TABLET | Freq: Two times a day (BID) | ORAL | 1 refills | Status: AC

## 2017-07-20 MED ORDER — CLOPIDOGREL BISULFATE 75 MG PO TABS: 75.0000 mg | ORAL_TABLET | Freq: Every day | ORAL | 1 refills | Status: AC

## 2017-07-20 NOTE — Discharge Summary (Signed)
[facility] [place] [facility]   PATIENT NAME: Desiree Carey    MR#:  [id number]  DATE OF BIRTH:  July 15, 1960  DATE OF ADMISSION:  07/18/2017 ADMITTING PHYSICIAN: Milagros LollSrikar Sudini, MD  DATE OF DISCHARGE: 07/20/2017  PRIMARY CARE PHYSICIAN: Curtis SitesKlein, Bert J III, MD    ADMISSION DIAGNOSIS:  NSTEMI (non-ST elevated myocardial infarction) (HCC) [I21.4]  DISCHARGE DIAGNOSIS:  NSTEMI s/p cardiac cath with severe CAD--medical management per Cardiology  SECONDARY DIAGNOSIS:   Past Medical History:  Diagnosis Date  . Anemia   . Anxiety   . Asthma   . CAD (coronary artery disease)    CABG 2012  . Depression   . Diabetes mellitus without complication (HCC)   . Diverticulitis   . Heart attack (HCC)   . Hyperlipemia   . Hypertension   . IBS (irritable bowel syndrome)   . Thyroid disease     HOSPITAL COURSE:   CharlotteHawkinsis a57 y.o.femalewith a known history of CAD with CABG in 2012, hypertension, anxiety, chronic angina presents to the hospital complaining of chest pain, fatigue and not feeling well. Here patient has been found to have non-ST elevation MI with troponin of 5. ST-T wave changes  *AcuteNSTEMI -Troponin elevated at 5--- 7.76 --was IV heparin drip and nitroglycerin drip for chest pain--now d/ced.   -Status post cardiac catheterization   --- Ost RCA to Prox RCA lesion is 100% stenosed.  Ost LM to Mid LM lesion is 75% stenosed.  Ost Cx to Dist Cx lesion is 90% stenosed.  Prox LAD lesion is 100% stenosed.  Ost 1st Diag lesion is 85% stenosed.  Prox Graft lesion is 100% stenosed. Previously placed Ost 1st Mrg to 1st Mrg stent (unknown type) is widely patent. -Patient is on aspirin,Plavix, statins, ranexa (started here) Coreg, imdur per Dr Glennis Brinkcallwood's recommendations  *Chronic diastolic CHF. No signs of fluid overload.  *Uncontrolled diabetes mellitus with hyperglycemia.  -Patient will be on Lantus and pre-meal  NovoLog along with sliding scale insulin.  *Hypertension. Continue home medications. Hold lisinopril.  *DVT prophylaxis.    D/c home with out pt f/u with dr Juliann Parescallwood. Pt agreeable   CONSULTS OBTAINED:  Treatment Team:  [facility], [facility], MD  DRUG ALLERGIES:   Allergies  Allergen Reactions  . Clindamycin/Lincomycin Other (See Comments)    Thrush   . Hydrocodone Bitartrate Er Itching  . Lidocaine   . Metformin And Related Other (See Comments)    Fatigue   . Oxycodone Other (See Comments)    Hallucination   . Penicillins Hives  . Pioglitazone Hcl-Glimepiride Hives  . Ramipril Swelling  . Sertraline Hcl Other (See Comments)    Hallucination   . Sitagliptin Other (See Comments)    Headache, chest pain  . Sulfur   . Ondansetron Rash    DISCHARGE MEDICATIONS:   Allergies as of 07/20/2017      Reactions   Clindamycin/lincomycin Other (See Comments)   Thrush   Hydrocodone Bitartrate Er Itching   Lidocaine    Metformin And Related Other (See Comments)   Fatigue   Oxycodone Other (See Comments)   Hallucination   Penicillins Hives   Pioglitazone Hcl-glimepiride Hives   Ramipril Swelling   Sertraline Hcl Other (See Comments)   Hallucination   Sitagliptin Other (See Comments)   Headache, chest pain   Sulfur    Ondansetron Rash      Medication List    TAKE these medications   aspirin 81 MG EC tablet Take 1 tablet (81  mg total) by mouth daily. Start taking on:  07/21/2017   atorvastatin 10 MG tablet Commonly known as:  LIPITOR Take 10 mg by mouth every evening.   carvedilol 40 MG 24 hr capsule Commonly known as:  COREG CR Take 40 mg by mouth daily.   cloNIDine 0.1 MG tablet Commonly known as:  CATAPRES Take 0.1 mg by mouth 2 (two) times daily.   clopidogrel 75 MG tablet Commonly known as:  PLAVIX Take 1 tablet (75 mg total) by mouth daily. Start taking on:  07/21/2017   DULoxetine 30 MG capsule Commonly known as:  CYMBALTA Take 30 mg by mouth  daily.   gabapentin 800 MG tablet Commonly known as:  NEURONTIN Take 800 mg by mouth 4 (four) times daily.   hydrochlorothiazide 25 MG tablet Commonly known as:  HYDRODIURIL Take 25 mg by mouth daily.   insulin aspart 100 UNIT/ML injection Commonly known as:  novoLOG Inject 32 Units into the skin 3 (three) times daily with meals.   insulin detemir 100 unit/ml Soln Commonly known as:  LEVEMIR Inject 80 Units into the skin at bedtime.   isosorbide mononitrate 120 MG 24 hr tablet Commonly known as:  IMDUR Take 1 tablet (120 mg total) by mouth daily. What changed:    medication strength  how much to take   levothyroxine 175 MCG tablet Commonly known as:  SYNTHROID, LEVOTHROID Take 175 mcg by mouth daily before breakfast.   lisinopril 2.5 MG tablet Commonly known as:  PRINIVIL,ZESTRIL Take 2.5 mg by mouth daily.   omeprazole 40 MG capsule Commonly known as:  PRILOSEC Take 40 mg by mouth daily.   potassium chloride SA 20 MEQ tablet Commonly known as:  K-DUR,KLOR-CON Take 20 mEq by mouth.   promethazine 12.5 MG tablet Commonly known as:  PHENERGAN Take 1 tablet (12.5 mg total) by mouth every 8 (eight) hours as needed for nausea or vomiting.   ranolazine 500 MG 12 hr tablet Commonly known as:  RANEXA Take 1 tablet (500 mg total) by mouth 2 (two) times daily.       If you experience worsening of your admission symptoms, develop shortness of breath, life threatening emergency, suicidal or homicidal thoughts you must seek medical attention immediately by calling 911 or calling your MD immediately  if symptoms less severe.  You Must read complete instructions/literature along with all the possible adverse reactions/side effects for all the Medicines you take and that have been prescribed to you. Take any new Medicines after you have completely understood and accept all the possible adverse reactions/side effects.   Please note  You were cared for by a hospitalist  during your hospital stay. If you have any questions about your discharge medications or the care you received while you were in the hospital after you are discharged, you can call the unit and asked to speak with the hospitalist on call if the hospitalist that took care of you is not available. Once you are discharged, your primary care physician will handle any further medical issues. Please note that NO REFILLS for any discharge medications will be authorized once you are discharged, as it is imperative that you return to your primary care physician (or establish a relationship with a primary care physician if you do not have one) for your aftercare needs so that they can reassess your need for medications and monitor your lab values. Today   SUBJECTIVE  Doing well Eating CHICK FILA!!   VITAL SIGNS:  Blood pressure 108/61,  pulse 77, temperature 98.2 F (36.8 C), temperature source Oral, resp. rate 15, height 5\' 7"  (1.702 m), weight 77.7 kg (171 lb 4.8 oz), SpO2 95 %.  I/O:    Intake/Output Summary (Last 24 hours) at 07/20/2017 1249 Last data filed at 07/20/2017 1103 Gross per 24 hour  Intake 1252.62 ml  Output 1000 ml  Net 252.62 ml    PHYSICAL EXAMINATION:  GENERAL:  57 y.o.-year-old patient lying in the bed with no acute distress.  EYES: Pupils equal, round, reactive to light and accommodation. No scleral icterus. Extraocular muscles intact.  HEENT: Head atraumatic, normocephalic. Oropharynx and nasopharynx clear.  NECK:  Supple, no jugular venous distention. No thyroid enlargement, no tenderness.  LUNGS: Normal breath sounds bilaterally, no wheezing, rales,rhonchi or crepitation. No use of accessory muscles of respiration.  CARDIOVASCULAR: S1, S2 normal. No murmurs, rubs, or gallops.  ABDOMEN: Soft, non-tender, non-distended. Bowel sounds present. No organomegaly or mass.  EXTREMITIES: No pedal edema, cyanosis, or clubbing.  NEUROLOGIC: Cranial nerves II through XII are intact.  Muscle strength 5/5 in all extremities. Sensation intact. Gait not checked.  PSYCHIATRIC: The patient is alert and oriented x 3.  SKIN: No obvious rash, lesion, or ulcer.   DATA REVIEW:   CBC  Recent Labs  Lab 07/19/17 0448  WBC 10.6  HGB 12.6  HCT 36.1  PLT 202    Chemistries  Recent Labs  Lab 07/20/17 0916  NA 138  K 3.3*  CL 106  CO2 25  GLUCOSE 126*  BUN 8  CREATININE 0.87  CALCIUM 8.3*  MG 1.6*    Microbiology Results   Recent Results (from the past 240 hour(s))  MRSA PCR Screening     Status: None   Collection Time: 07/18/17  9:05 PM  Result Value Ref Range Status   MRSA by PCR NEGATIVE NEGATIVE Final    Comment:        The GeneXpert MRSA Assay (FDA approved for NASAL specimens only), is one component of a comprehensive MRSA colonization surveillance program. It is not intended to diagnose MRSA infection nor to guide or monitor treatment for MRSA infections. Performed at [facility], [address] [place], [place] [address]     RADIOLOGY:  Dg Chest Portable 1 View  Result Date: 07/18/2017 CLINICAL DATA:  Chest pain, nausea, and overall weakness that started 3 days ago. EXAM: PORTABLE CHEST 1 VIEW COMPARISON:  04/27/2017. FINDINGS: Low lung volumes. Mild cardiac enlargement. Prior median sternotomy for CABG. Mild vascular congestion. No consolidation or edema. Mild scarring LEFT lung base is stable. IMPRESSION: Worsening aeration. Mild cardiac enlargement. Mild vascular congestion without consolidation edema. Electronically Signed   By: Elsie Stain M.D.   On: 07/18/2017 17:55     Management plans discussed with the patient, family and they are in agreement.  CODE STATUS:     Code Status Orders  (From admission, onward)        Start     Ordered   07/18/17 1853  Full code  Continuous     07/18/17 1854    Code Status History    Date Active Date Inactive Code Status Order ID Comments User Context   04/27/2017 22:49 04/29/2017  00:13 Full Code [id number]  Oralia Manis, MD Inpatient      TOTAL TIME TAKING CARE OF THIS PATIENT: 40 minutes.    Enedina Finner M.D on 07/20/2017 at 12:49 PM  Between 7am to 6pm - Pager - [phone] After 6pm go to [email or web address] - password  [email or web address]  [facility]  Office  [phone]  CC: Primary care physician; Lynnea Ferrier, MD

## 2017-07-20 NOTE — Progress Notes (Signed)
Inpatient Diabetes Program Recommendations  AACE/ADA: New Consensus Statement on Inpatient Glycemic Control (2015)  Target Ranges:  Prepandial:   less than 140 mg/dL      Peak postprandial:   less than 180 mg/dL (1-2 hours)      Critically ill patients:  140 - 180 mg/dL   Lab Results  Component Value Date   GLUCAP 219 (H) 07/20/2017   HGBA1C 10.7 (H) 07/18/2017    Review of Glycemic Control  Results for Desiree Carey, Desiree Carey (MRN [id number]) as of 07/20/2017 08:26  Ref. Range 07/19/2017 15:34 07/19/2017 16:58 07/19/2017 17:20 07/19/2017 21:42 07/20/2017 07:33  Glucose-Capillary Latest Ref Range: 65 - 99 mg/dL 82 60 (L) 79 409183 (H) 811219 (H)   Diabetes history: Type 2 Outpatient Diabetes medications: Novolog 32 units tid with meals, Levemir 80 units qhs  Current orders for Inpatient glycemic control: Novolog 0-15 units tid, Novolog 0-5 units qhs, Novolog 26 units tid, Levemir 70 units qhs  Inpatient Diabetes Program Recommendations: Consider changing mealtime Novolog to 10 units tid (hold if she eats less than 50%) and decrease hs Lantus to 60 units  Susette RacerJulie Any Mcneice, RN, [place], [place], CDE Diabetes Coordinator Inpatient Diabetes Program  [phone] (Team Pager) [phone] [facility] Office) 07/20/2017 8:29 AM

## 2017-07-20 NOTE — Progress Notes (Signed)
Patient called out stating her blood sugar felt low, blood glucose checked and is 40.  Patient alert talking on her cell phone and eating a biscuit that her son brought her.  NT Desiree Carey took patient orange juice.  Will recheck CBG.

## 2017-07-20 NOTE — Progress Notes (Signed)
Blood glucose rechecked and is 77.

## 2017-07-20 NOTE — Progress Notes (Signed)
Patient ambulated around ICU and tolerated well.  o2 sats 96% on RA post ambulation.  Denies chest pain.  Blood glucose rechecked and is 84.  Dr. Allena KatzPatel was present at 1245 and gave order to ambulate patient and to recheck blood glucose and if blood glucose normal then to proceed with discharge.

## 2017-07-20 NOTE — Progress Notes (Addendum)
  [facility]   ASSESSMENT/PLAN   Coronary artery disease, status post s/p cath, patient is presently on aspirin, Plavix, Lipitor, Isordil. Stable hemodynamics, patient without chest pain. Pending cardiology accommodation disposition.  Hypokalemia. We'll replace  Diabetes. On Levemir and scale coverage  INTAKE / OUTPUT:  Intake/Output Summary (Last 24 hours) at 07/20/2017 0935 Last data filed at 07/20/2017 0600 Gross per 24 hour  Intake 1127.62 ml  Output 2100 ml  Net -972.38 ml    Name: Desiree RiegerCharlotte Carey MRN: [id number] DOB: 1960-06-25    ADMISSION DATE:  07/18/2017  SUBJECTIVE:   Patient is doing well this morning, denies any chest pain, status post cardiac catheterization, doing well  VITAL SIGNS: Temp:  [98 F (36.7 C)-98.4 F (36.9 C)] 98.2 F (36.8 C) (02/08 0800) Pulse Rate:  [71-82] 81 (02/08 0800) Resp:  [11-21] 11 (02/08 0800) BP: (93-121)/(57-72) 116/62 (02/08 0800) SpO2:  [93 %-99 %] 95 % (02/08 0800) Weight:  [171 lb 4.8 oz (77.7 kg)] 171 lb 4.8 oz (77.7 kg) (02/08 0500)  PHYSICAL EXAMINATION: Physical Examination:   VS: BP 116/62   Pulse 81   Temp 98.2 F (36.8 C) (Oral)   Resp 11   Ht 5\' 7"  (1.702 m)   Wt 171 lb 4.8 oz (77.7 kg)   SpO2 95%   BMI 26.83 kg/m   General Appearance: No distress  Neuro:without focal findings, mental status normal. HEENT: PERRLA, EOM intact. Pulmonary: normal breath sounds   CardiovascularNormal S1,S2.  No m/r/g.   Abdomen: Benign, Soft, non-tender. Skin:   warm, no rashes, no ecchymosis  Extremities: normal, no cyanosis, clubbing.    LABORATORY PANEL:   CBC Recent Labs  Lab 07/19/17 0448  WBC 10.6  HGB 12.6  HCT 36.1  PLT 202    Chemistries  Recent Labs  Lab 07/19/17 0448  NA 136  K 3.2*  CL 104  CO2 24  GLUCOSE 164*  BUN 15  CREATININE 0.90  CALCIUM 8.2*    Recent Labs  Lab 07/19/17 1416 07/19/17 1534 07/19/17 1658 07/19/17 1720 07/19/17 2142  07/20/17 0733  GLUCAP 74 82 60* 79 183* 219*   No results for input(s): PHART, PCO2ART, PO2ART in the last 168 hours. No results for input(s): AST, ALT, ALKPHOS, BILITOT, ALBUMIN in the last 168 hours.  Cardiac Enzymes Recent Labs  Lab 07/19/17 0448  TROPONINI 7.76*    RADIOLOGY:  Dg Chest Portable 1 View  Result Date: 07/18/2017 CLINICAL DATA:  Chest pain, nausea, and overall weakness that started 3 days ago. EXAM: PORTABLE CHEST 1 VIEW COMPARISON:  04/27/2017. FINDINGS: Low lung volumes. Mild cardiac enlargement. Prior median sternotomy for CABG. Mild vascular congestion. No consolidation or edema. Mild scarring LEFT lung base is stable. IMPRESSION: Worsening aeration. Mild cardiac enlargement. Mild vascular congestion without consolidation edema. Electronically Signed   By: Elsie StainJohn T Curnes M.D.   On: 07/18/2017 17:55    Tora KindredJohn Smiley Birr, DO  2/8/2019Patient ID: Desiree Carey, female   DOB: 1960-06-25, 57 y.o.   MRN: [id number]

## 2017-07-20 NOTE — Progress Notes (Signed)
Discharge instructions given to and reviewed with patient.  Patient verbalized understanding of all discharge instructions including follow up appointments, changes in medications, new medications and activity.  Right groin site free of complications.  Denies chest pain.  Patient left ICU by wheelchair with Aram Beechamynthia, NT and being discharged home riding with sister.

## 2017-07-20 NOTE — Progress Notes (Signed)
Patient informed this RN that she does not see Dr. Graciela HusbandsKlein that she sees Dr. Letta PateAycock for primary care.  RN attempted to call and make an appointment with Dr. Letta PateAycock for follow up but clinic is closed.  RN called and cancelled appointment with Dr. Graciela HusbandsKlein.  RN instructed patient that she will have to call and make follow up appointment with Dr. Letta PateAycock when their office is open.

## 2017-07-20 NOTE — Progress Notes (Signed)
Inpatient Diabetes Program Recommendations  AACE/ADA: New Consensus Statement on Inpatient Glycemic Control (2015)  Target Ranges:  Prepandial:   less than 140 mg/dL      Peak postprandial:   less than 180 mg/dL (1-2 hours)      Critically ill patients:  140 - 180 mg/dL   Lab Results  Component Value Date   GLUCAP 84 07/20/2017   HGBA1C 10.7 (H) 07/18/2017    Inpatient Diabetes Program Recommendations: Met with patient to discuss insulin use at home- tells me she takes her insulin as ordered- Levemir 70 units and Novolog 20 units tid.  I discussed her high A1C reading.   Late sign- error in closing at the time of admission Gentry Fitz, RN, [place], [place], CDE Diabetes Coordinator Inpatient Diabetes Program  [phone] (Team Pager) [phone] (Mariano Colon)

## 2017-07-20 NOTE — Progress Notes (Signed)
Dr. Lonn Georgiaonforti spoke with Diabetes coordinator, Raynelle FanningJulie and MD gave this RN order to change lantus order to 60 units qhs and to change meal time coverage to 10 units TID to be held if patient eats less than 50%.

## 2017-07-20 NOTE — Discharge Instructions (Signed)
Check your sugars 3-4 times a day

## 2017-08-06 ENCOUNTER — Observation Stay
Admission: EM | Admit: 2017-08-06 | Discharge: 2017-08-08 | Disposition: A | Discharge status: Home / Self Care | Payer: Medicaid Other | Attending: Internal Medicine | Admitting: Internal Medicine

## 2017-08-06 ENCOUNTER — Encounter: Payer: Self-pay | Admitting: Emergency Medicine

## 2017-08-06 ENCOUNTER — Emergency Department: Payer: Medicaid Other

## 2017-08-06 ENCOUNTER — Observation Stay: Payer: Medicaid Other

## 2017-08-06 ENCOUNTER — Other Ambulatory Visit: Payer: Self-pay

## 2017-08-06 ENCOUNTER — Ambulatory Visit: Payer: Medicaid Other

## 2017-08-06 DIAGNOSIS — F17210 Nicotine dependence, cigarettes, uncomplicated: Secondary | ICD-10-CM | POA: Insufficient documentation

## 2017-08-06 DIAGNOSIS — Z7982 Long term (current) use of aspirin: Secondary | ICD-10-CM | POA: Insufficient documentation

## 2017-08-06 DIAGNOSIS — I959 Hypotension, unspecified: Secondary | ICD-10-CM | POA: Insufficient documentation

## 2017-08-06 DIAGNOSIS — Z79890 Hormone replacement therapy (postmenopausal): Secondary | ICD-10-CM | POA: Insufficient documentation

## 2017-08-06 DIAGNOSIS — Z7902 Long term (current) use of antithrombotics/antiplatelets: Secondary | ICD-10-CM | POA: Diagnosis not present

## 2017-08-06 DIAGNOSIS — Z794 Long term (current) use of insulin: Secondary | ICD-10-CM | POA: Diagnosis not present

## 2017-08-06 DIAGNOSIS — I252 Old myocardial infarction: Secondary | ICD-10-CM | POA: Insufficient documentation

## 2017-08-06 DIAGNOSIS — I2510 Atherosclerotic heart disease of native coronary artery without angina pectoris: Secondary | ICD-10-CM | POA: Diagnosis not present

## 2017-08-06 DIAGNOSIS — E039 Hypothyroidism, unspecified: Secondary | ICD-10-CM | POA: Diagnosis not present

## 2017-08-06 DIAGNOSIS — R778 Other specified abnormalities of plasma proteins: Secondary | ICD-10-CM

## 2017-08-06 DIAGNOSIS — E785 Hyperlipidemia, unspecified: Secondary | ICD-10-CM | POA: Diagnosis not present

## 2017-08-06 DIAGNOSIS — I10 Essential (primary) hypertension: Secondary | ICD-10-CM | POA: Diagnosis not present

## 2017-08-06 DIAGNOSIS — M79661 Pain in right lower leg: Secondary | ICD-10-CM | POA: Insufficient documentation

## 2017-08-06 DIAGNOSIS — F329 Major depressive disorder, single episode, unspecified: Secondary | ICD-10-CM | POA: Diagnosis not present

## 2017-08-06 DIAGNOSIS — M79662 Pain in left lower leg: Secondary | ICD-10-CM | POA: Diagnosis not present

## 2017-08-06 DIAGNOSIS — R200 Anesthesia of skin: Secondary | ICD-10-CM | POA: Diagnosis present

## 2017-08-06 DIAGNOSIS — R748 Abnormal levels of other serum enzymes: Secondary | ICD-10-CM | POA: Insufficient documentation

## 2017-08-06 DIAGNOSIS — F419 Anxiety disorder, unspecified: Secondary | ICD-10-CM | POA: Insufficient documentation

## 2017-08-06 DIAGNOSIS — Z79899 Other long term (current) drug therapy: Secondary | ICD-10-CM | POA: Insufficient documentation

## 2017-08-06 DIAGNOSIS — G459 Transient cerebral ischemic attack, unspecified: Secondary | ICD-10-CM | POA: Diagnosis not present

## 2017-08-06 DIAGNOSIS — I639 Cerebral infarction, unspecified: Secondary | ICD-10-CM

## 2017-08-06 DIAGNOSIS — R7989 Other specified abnormal findings of blood chemistry: Secondary | ICD-10-CM

## 2017-08-06 DIAGNOSIS — E119 Type 2 diabetes mellitus without complications: Secondary | ICD-10-CM | POA: Diagnosis not present

## 2017-08-06 DIAGNOSIS — Z951 Presence of aortocoronary bypass graft: Secondary | ICD-10-CM | POA: Diagnosis not present

## 2017-08-06 DIAGNOSIS — R202 Paresthesia of skin: Secondary | ICD-10-CM

## 2017-08-06 LAB — PROTIME-INR
INR: 0.95
Prothrombin Time: 12.6 seconds (ref 11.4–15.2)

## 2017-08-06 LAB — CBC WITH DIFFERENTIAL/PLATELET
Basophils Absolute: 0 10*3/uL (ref 0–0.1)
Basophils Relative: 1 %
Eosinophils Absolute: 0.4 10*3/uL (ref 0–0.7)
Eosinophils Relative: 5 %
HCT: 36.8 % (ref 35.0–47.0)
HEMOGLOBIN: 12.5 g/dL (ref 12.0–16.0)
LYMPHS ABS: 2.1 10*3/uL (ref 1.0–3.6)
LYMPHS PCT: 24 %
MCH: 27.9 pg (ref 26.0–34.0)
MCHC: 34.1 g/dL (ref 32.0–36.0)
MCV: 81.8 fL (ref 80.0–100.0)
Monocytes Absolute: 0.7 10*3/uL (ref 0.2–0.9)
Monocytes Relative: 8 %
NEUTROS PCT: 62 %
Neutro Abs: 5.4 10*3/uL (ref 1.4–6.5)
Platelets: 281 10*3/uL (ref 150–440)
RBC: 4.5 MIL/uL (ref 3.80–5.20)
RDW: 14.3 % (ref 11.5–14.5)
WBC: 8.6 10*3/uL (ref 3.6–11.0)

## 2017-08-06 LAB — COMPREHENSIVE METABOLIC PANEL
ALK PHOS: 62 U/L (ref 38–126)
ALT: 10 U/L — AB (ref 14–54)
AST: 16 U/L (ref 15–41)
Albumin: 3.8 g/dL (ref 3.5–5.0)
Anion gap: 10 (ref 5–15)
BUN: 17 mg/dL (ref 6–20)
CALCIUM: 8.7 mg/dL — AB (ref 8.9–10.3)
CO2: 24 mmol/L (ref 22–32)
CREATININE: 1.16 mg/dL — AB (ref 0.44–1.00)
Chloride: 99 mmol/L — ABNORMAL LOW (ref 101–111)
GFR, EST NON AFRICAN AMERICAN: 52 mL/min — AB (ref >60)
Glucose, Bld: 471 mg/dL — ABNORMAL HIGH (ref 65–99)
Potassium: 3.7 mmol/L (ref 3.5–5.1)
Sodium: 133 mmol/L — ABNORMAL LOW (ref 135–145)
Total Bilirubin: 0.3 mg/dL (ref 0.3–1.2)
Total Protein: 7.2 g/dL (ref 6.5–8.1)

## 2017-08-06 LAB — APTT: aPTT: 33 seconds (ref 24–36)

## 2017-08-06 LAB — TROPONIN I
TROPONIN I: 0.72 ng/mL — AB (ref <0.03)
Troponin I: 0.66 ng/mL (ref <0.03)

## 2017-08-06 MED ORDER — MORPHINE SULFATE (PF) 4 MG/ML IV SOLN
4.0000 mg | Freq: Once | INTRAVENOUS | Status: AC
Start: 2017-08-06 — End: 2017-08-06
  Administered 2017-08-06: 4 mg via INTRAVENOUS

## 2017-08-06 MED ORDER — HEPARIN SODIUM (PORCINE) 5000 UNIT/ML IJ SOLN
5000.0000 [IU] | Freq: Three times a day (TID) | INTRAMUSCULAR | Status: DC
  Filled 2017-08-06: qty 1

## 2017-08-06 MED ORDER — SIMVASTATIN 20 MG PO TABS
40.0000 mg | ORAL_TABLET | Freq: Every day | ORAL | Status: DC
  Administered 2017-08-07: 17:00:00 40 mg via ORAL
  Filled 2017-08-06: qty 2

## 2017-08-06 MED ORDER — MORPHINE SULFATE (PF) 4 MG/ML IV SOLN
4.0000 mg | Freq: Once | INTRAVENOUS | Status: AC
  Administered 2017-08-06: 4 mg via INTRAMUSCULAR
  Filled 2017-08-06: qty 1

## 2017-08-06 MED ORDER — LISINOPRIL 5 MG PO TABS
2.5000 mg | ORAL_TABLET | Freq: Every day | ORAL | Status: DC
  Administered 2017-08-07: 2.5 mg via ORAL
  Filled 2017-08-06 (×2): qty 1

## 2017-08-06 MED ORDER — ISOSORBIDE MONONITRATE ER 30 MG PO TB24
120.0000 mg | ORAL_TABLET | Freq: Every day | ORAL | Status: DC
  Administered 2017-08-07: 120 mg via ORAL
  Filled 2017-08-06 (×2): qty 4

## 2017-08-06 MED ORDER — PANTOPRAZOLE SODIUM 40 MG PO TBEC
40.0000 mg | DELAYED_RELEASE_TABLET | Freq: Every day | ORAL | Status: DC
  Administered 2017-08-07 – 2017-08-08 (×2): 40 mg via ORAL
  Filled 2017-08-06 (×2): qty 1

## 2017-08-06 MED ORDER — PROMETHAZINE HCL 12.5 MG PO TABS
12.5000 mg | ORAL_TABLET | Freq: Three times a day (TID) | ORAL | Status: DC | PRN
  Filled 2017-08-06: qty 1

## 2017-08-06 MED ORDER — HEPARIN (PORCINE) IN NACL 100-0.45 UNIT/ML-% IJ SOLN
1050.0000 [IU]/h | INTRAMUSCULAR | Status: DC
  Administered 2017-08-06: 900 [IU]/h via INTRAVENOUS
  Filled 2017-08-06: qty 250

## 2017-08-06 MED ORDER — HYDROCHLOROTHIAZIDE 25 MG PO TABS
25.0000 mg | ORAL_TABLET | Freq: Every day | ORAL | Status: DC
  Administered 2017-08-07: 10:00:00 25 mg via ORAL
  Filled 2017-08-06 (×2): qty 1

## 2017-08-06 MED ORDER — LEVOTHYROXINE SODIUM 50 MCG PO TABS
175.0000 ug | ORAL_TABLET | Freq: Every day | ORAL | Status: DC
  Administered 2017-08-07 – 2017-08-08 (×2): 175 ug via ORAL
  Filled 2017-08-06 (×2): qty 1

## 2017-08-06 MED ORDER — INSULIN DETEMIR 100 UNIT/ML ~~LOC~~ SOLN
80.0000 [IU] | Freq: Every day | SUBCUTANEOUS | Status: DC
Start: 2017-08-07 — End: 2017-08-07
  Filled 2017-08-06 (×2): qty 0.8

## 2017-08-06 MED ORDER — CARVEDILOL PHOSPHATE ER 40 MG PO CP24
40.0000 mg | ORAL_CAPSULE | Freq: Every day | ORAL | Status: DC
  Administered 2017-08-07: 40 mg via ORAL
  Filled 2017-08-06 (×2): qty 1

## 2017-08-06 MED ORDER — GABAPENTIN 800 MG PO TABS
800.0000 mg | ORAL_TABLET | Freq: Four times a day (QID) | ORAL | Status: DC
  Filled 2017-08-06 (×3): qty 1

## 2017-08-06 MED ORDER — CLOPIDOGREL BISULFATE 75 MG PO TABS
75.0000 mg | ORAL_TABLET | Freq: Every day | ORAL | Status: DC
  Administered 2017-08-07 – 2017-08-08 (×2): 75 mg via ORAL
  Filled 2017-08-06 (×2): qty 1

## 2017-08-06 MED ORDER — DULOXETINE HCL 30 MG PO CPEP
30.0000 mg | ORAL_CAPSULE | Freq: Every day | ORAL | Status: DC
  Administered 2017-08-07 – 2017-08-08 (×2): 30 mg via ORAL
  Filled 2017-08-06 (×2): qty 1

## 2017-08-06 MED ORDER — POTASSIUM CHLORIDE CRYS ER 20 MEQ PO TBCR
20.0000 meq | EXTENDED_RELEASE_TABLET | Freq: Once | ORAL | Status: AC
  Administered 2017-08-07: 20 meq via ORAL
  Filled 2017-08-06: qty 1

## 2017-08-06 MED ORDER — MORPHINE SULFATE (PF) 4 MG/ML IV SOLN
INTRAVENOUS | Status: AC
  Administered 2017-08-06: 4 mg via INTRAVENOUS
  Filled 2017-08-06: qty 1

## 2017-08-06 MED ORDER — CLONIDINE HCL 0.1 MG PO TABS
0.1000 mg | ORAL_TABLET | Freq: Two times a day (BID) | ORAL | Status: DC
  Administered 2017-08-07: 0.1 mg via ORAL
  Filled 2017-08-06 (×2): qty 1

## 2017-08-06 MED ORDER — RANOLAZINE ER 500 MG PO TB12
500.0000 mg | ORAL_TABLET | Freq: Two times a day (BID) | ORAL | Status: DC
  Administered 2017-08-07 – 2017-08-08 (×4): 500 mg via ORAL
  Filled 2017-08-06 (×5): qty 1

## 2017-08-06 MED ORDER — HEPARIN BOLUS VIA INFUSION
4000.0000 [IU] | Freq: Once | INTRAVENOUS | Status: AC
  Administered 2017-08-06: 4000 [IU] via INTRAVENOUS
  Filled 2017-08-06: qty 4000

## 2017-08-06 MED ORDER — DOCUSATE SODIUM 100 MG PO CAPS: 100.0000 mg | ORAL_CAPSULE | Freq: Two times a day (BID) | ORAL | Status: DC | PRN

## 2017-08-06 MED ORDER — ASPIRIN EC 81 MG PO TBEC
81.0000 mg | DELAYED_RELEASE_TABLET | Freq: Every day | ORAL | Status: DC
  Administered 2017-08-07 – 2017-08-08 (×2): 81 mg via ORAL
  Filled 2017-08-06 (×2): qty 1

## 2017-08-06 NOTE — ED Triage Notes (Signed)
Awoke this am with L hand numbness. States this was about 0330. States thought she had slept on it wrong but has not resolved. No weakness in grip. States approx 1600 noted numbness in fingers to R hand. Smile symmetrical. Leg strength equal.

## 2017-08-06 NOTE — H&P (Addendum)
[employer] [facility] [place] [facility]   PATIENT NAME: Desiree Carey    MR#:  [id number]  DATE OF BIRTH:  11-11-1960  DATE OF ADMISSION:  08/06/2017  PRIMARY CARE PHYSICIAN: Emogene Morgan, MD   REQUESTING/REFERRING PHYSICIAN: [email or web address]  CHIEF COMPLAINT:   Chief Complaint  Patient presents with  . Numbness    HISTORY OF PRESENT ILLNESS: Desiree Carey  is a 57 y.o. female with a known history of anxiety, asthma, coronary artery disease status post CABG and recent non-ST elevation MI and catheterization 2 weeks ago, diabetes, diverticulitis, hyperlipidemia, hypertension, thyroid disease- since morning today has numbness on her left arm but able to move it and hold things with that. So she continue doing her day-to-day work but until afternoon it didn't go away so called cardiologist office. They suggested to go to emergency room for further workup. She also had some complaining of bilateral calf pain for last few weeks and for that cardiologist and referred her to vascular surgery where she had not have an appointment yet.  In ER CT scan of the head was negative. But patient's symptoms continued and she had slightly elevated troponin. ER physician spoke to on call cardiologist and they suggested as 2 weeks ago troponin was much higher with a non-ST elevation MI, likely this slight elevation in troponin is continuation of that but because of her symptoms she may need to be observed in the hospital as she had significant coronary artery disease as per recent catheter. She denies any associated chest pain or palpitation today. She denies any headache, loss of vision.  PAST MEDICAL HISTORY:   Past Medical History:  Diagnosis Date  . Anemia   . Anxiety   . Asthma   . CAD (coronary artery disease)    CABG 2012  . Depression   . Diabetes mellitus without complication (HCC)   . Diverticulitis   . Heart attack (HCC)   . Hyperlipemia   . Hypertension   . IBS (irritable  bowel syndrome)   . Thyroid disease     PAST SURGICAL HISTORY:  Past Surgical History:  Procedure Laterality Date  . ABDOMINAL HYSTERECTOMY    . CARDIAC SURGERY    . JOINT REPLACEMENT    . LEFT HEART CATH AND CORONARY ANGIOGRAPHY N/A 07/19/2017   Procedure: LEFT HEART CATH AND CORONARY ANGIOGRAPHY and possible PCI;  Surgeon: Alwyn Pea, MD;  Location: [facility] INVASIVE CV LAB;  Service: Cardiovascular;  Laterality: N/A;  . SKIN GRAFT      SOCIAL HISTORY:  Social History   Tobacco Use  . Smoking status: Current Every Day Smoker    Packs/day: 0.50    Types: Cigarettes  . Smokeless tobacco: Never Used  Substance Use Topics  . Alcohol use: No    Frequency: Never    FAMILY HISTORY:  Family History  Problem Relation Age of Onset  . Diabetes Mother   . Hypertension Mother   . Stroke Mother   . Heart attack Mother     DRUG ALLERGIES:  Allergies  Allergen Reactions  . Clindamycin/Lincomycin Other (See Comments)    Thrush   . Hydrocodone Bitartrate Er Itching  . Lidocaine     unknown  . Metformin And Related Other (See Comments)    Fatigue   . Oxycodone Other (See Comments)    Hallucination   . Penicillins Hives    Has patient had a PCN reaction causing immediate rash, facial/tongue/throat swelling, SOB or lightheadedness with hypotension: No Has  patient had a PCN reaction causing severe rash involving mucus membranes or skin necrosis: No Has patient had a PCN reaction that required hospitalization: No Has patient had a PCN reaction occurring within the last 10 years: No If all of the above answers are "NO", then may proceed with Cephalosporin use.   . Pioglitazone Hcl-Glimepiride Hives  . Ramipril Swelling  . Sertraline Hcl Other (See Comments)    Hallucination   . Sitagliptin Other (See Comments)    Headache, chest pain  . Sulfur     unknown  . Ondansetron Rash    REVIEW OF SYSTEMS:   CONSTITUTIONAL: No fever, fatigue or weakness.  EYES: No blurred  or double vision.  EARS, NOSE, AND THROAT: No tinnitus or ear pain.  RESPIRATORY: No cough, shortness of breath, wheezing or hemoptysis.  CARDIOVASCULAR: No chest pain, orthopnea, edema.  GASTROINTESTINAL: No nausea, vomiting, diarrhea or abdominal pain.  GENITOURINARY: No dysuria, hematuria.  ENDOCRINE: No polyuria, nocturia,  HEMATOLOGY: No anemia, easy bruising or bleeding SKIN: No rash or lesion. MUSCULOSKELETAL: No joint pain or arthritis.   NEUROLOGIC: left hand and fore arm tingling, numbness, no weakness.  PSYCHIATRY: No anxiety or depression.   MEDICATIONS AT HOME:  Prior to Admission medications   Medication Sig Start Date End Date Taking? Authorizing Provider  aspirin EC 81 MG EC tablet Take 1 tablet (81 mg total) by mouth daily. 07/21/17  Yes Enedina Finner, MD  carvedilol (COREG CR) 40 MG 24 hr capsule Take 40 mg by mouth daily.   Yes [provider]  cloNIDine (CATAPRES) 0.1 MG tablet Take 0.1 mg by mouth 2 (two) times daily.    Yes [provider]  clopidogrel (PLAVIX) 75 MG tablet Take 1 tablet (75 mg total) by mouth daily. 07/21/17  Yes Enedina Finner, MD  DULoxetine (CYMBALTA) 30 MG capsule Take 30 mg by mouth daily.    Yes [provider]  gabapentin (NEURONTIN) 800 MG tablet Take 800 mg by mouth 4 (four) times daily.    Yes [provider]  hydrochlorothiazide (HYDRODIURIL) 25 MG tablet Take 25 mg by mouth daily.   Yes [provider]  insulin aspart (NOVOLOG) 100 UNIT/ML injection Inject 32 Units into the skin 3 (three) times daily with meals.    Yes [provider]  insulin detemir (LEVEMIR) 100 unit/ml SOLN Inject 80 Units into the skin at bedtime.   Yes [provider]  isosorbide mononitrate (IMDUR) 120 MG 24 hr tablet Take 1 tablet (120 mg total) by mouth daily. 07/20/17  Yes Enedina Finner, MD  levothyroxine (SYNTHROID, LEVOTHROID) 175 MCG tablet Take 175 mcg by mouth daily before breakfast.    Yes [provider]  lisinopril (PRINIVIL,ZESTRIL) 2.5 MG tablet Take 2.5 mg by mouth daily.    Yes [provider]  omeprazole (PRILOSEC) 40 MG capsule Take 40 mg by mouth daily.   Yes [provider]  potassium chloride SA (K-DUR,KLOR-CON) 20 MEQ tablet Take 20 mEq by mouth.   Yes [provider]  promethazine (PHENERGAN) 12.5 MG tablet Take 1 tablet (12.5 mg total) by mouth every 8 (eight) hours as needed for nausea or vomiting. 07/20/17  Yes Enedina Finner, MD  ranolazine (RANEXA) 500 MG 12 hr tablet Take 1 tablet (500 mg total) by mouth 2 (two) times daily. 07/20/17  Yes Enedina Finner, MD  simvastatin (ZOCOR) 40 MG tablet Take 40 mg by mouth daily.   Yes [provider]      PHYSICAL EXAMINATION:  VITAL SIGNS: Blood pressure 140/64, pulse 90, temperature 98.4 F (36.9 C), temperature source Oral, resp. rate 15, height 5\' 7"  (1.702 m), weight 75.3 kg (166 lb), SpO2 98 %.  GENERAL:  57 y.o.-year-old patient lying in the bed with no acute distress.  EYES: Pupils equal, round, reactive to light and accommodation. No scleral icterus. Extraocular muscles intact.  HEENT: Head atraumatic, normocephalic. Oropharynx and nasopharynx clear.  NECK:  Supple, no jugular venous distention. No thyroid enlargement, no tenderness.  LUNGS: Normal breath sounds bilaterally, no wheezing, rales,rhonchi or crepitation. No use of accessory muscles of respiration.  CARDIOVASCULAR: S1, S2 normal. No murmurs, rubs, or gallops.  ABDOMEN: Soft, nontender, nondistended. Bowel sounds present. No organomegaly or mass.  EXTREMITIES: No pedal edema, cyanosis, or clubbing.  NEUROLOGIC: Cranial nerves II through XII are intact. Muscle strength 5/5 in all extremities. Sensation intact. Gait not checked.  PSYCHIATRIC: The patient is alert and oriented x 3.  SKIN: No obvious rash, lesion, or ulcer.   LABORATORY PANEL:   CBC Recent Labs  Lab 08/06/17 1754  WBC 8.6  HGB 12.5  HCT 36.8  PLT 281   MCV 81.8  MCH 27.9  MCHC 34.1  RDW 14.3  LYMPHSABS 2.1  MONOABS 0.7  EOSABS 0.4  BASOSABS 0.0   ------------------------------------------------------------------------------------------------------------------  Chemistries  Recent Labs  Lab 08/06/17 1754  NA 133*  K 3.7  CL 99*  CO2 24  GLUCOSE 471*  BUN 17  CREATININE 1.16*  CALCIUM 8.7*  AST 16  ALT 10*  ALKPHOS 62  BILITOT 0.3   ------------------------------------------------------------------------------------------------------------------ estimated creatinine clearance is 58.7 mL/min (A) (by C-G formula based on SCr of 1.16 mg/dL (H)). ------------------------------------------------------------------------------------------------------------------ No results for input(s): TSH, T4TOTAL, T3FREE, THYROIDAB in the last 72 hours.  Invalid input(s): FREET3   Coagulation profile No results for input(s): INR, PROTIME in the last 168 hours. ------------------------------------------------------------------------------------------------------------------- No results for input(s): DDIMER in the last 72 hours. -------------------------------------------------------------------------------------------------------------------  Cardiac Enzymes Recent Labs  Lab 08/06/17 1754 08/06/17 2036  TROPONINI 0.72* 0.66*   ------------------------------------------------------------------------------------------------------------------ Invalid input(s): POCBNP  ---------------------------------------------------------------------------------------------------------------  Urinalysis    Component Value Date/Time   COLORURINE YELLOW (A) 04/27/2017 2354   APPEARANCEUR HAZY (A) 04/27/2017 2354   LABSPEC 1.006 04/27/2017 2354   PHURINE 6.0 04/27/2017 2354   GLUCOSEU >=500 (A) 04/27/2017 2354   HGBUR NEGATIVE 04/27/2017 2354   BILIRUBINUR NEGATIVE 04/27/2017 2354   KETONESUR NEGATIVE 04/27/2017 2354   PROTEINUR NEGATIVE  04/27/2017 2354   NITRITE NEGATIVE 04/27/2017 2354   LEUKOCYTESUR MODERATE (A) 04/27/2017 2354     RADIOLOGY: Ct Head Wo Contrast  Result Date: 08/06/2017 CLINICAL DATA:  Left hand numbness EXAM: CT HEAD WITHOUT CONTRAST TECHNIQUE: Contiguous axial images were obtained from the base of the skull through the vertex without intravenous contrast. COMPARISON:  None. FINDINGS: Brain: No acute territorial infarction, hemorrhage or intracranial mass is visualized. Mild small vessel ischemic changes of the white matter. More focal hypodensity within the right frontal white matter and basal ganglia probably reflects remote infarct. Mild ex vacuo dilatation of the right anterior ventricle. Ventricles are nonenlarged. Vascular: No hyperdense vessels. Vertebral artery calcification. Carotid artery calcification Skull: Normal. Negative for fracture or focal lesion. Sinuses/Orbits: No acute finding. Other: None IMPRESSION: 1. No definite CT evidence for acute intracranial abnormality 2. Mild small vessel ischemic changes of the white matter. Electronically Signed   By: Jasmine Pang M.D.   On: 08/06/2017 20:18    EKG: Orders placed or performed during the hospital encounter of 08/06/17  .  ED EKG  . ED EKG    IMPRESSION AND PLAN:  * Left forearm and hand numbness   As patient has multiple vascular issues including coronary artery disease, complaining of bilateral calf pain, I'm worried that she might have TIA or stroke.   He would like to do the full workup including carotid Doppler study and MRI of the brain. She recently had echocardiogram done 2-3 months ago, so I would not repeat this time.   Check lipid panel and if needed then call neurologic consult.   Patient is already taking antiplatelets and statins.  * Elevated troponin   Most likely this is continuation from 2 weeks ago when troponin was much higher, we'll keep on cardiac monitoring and follow serial troponins.   Patient does not have any  chest pain this time.   She is on optimal cardiac medication after recent non-ST elevation MI and catheterization.  * Hypertension   Continue home medication, stable.  * Diabetes   Continue basal insulin and keep on sliding-scale coverage.  * Hyperlipidemia   Continue simvastatin.  * Hypothyroidism   Continue levothyroxine.  * Active smoking   Counseled to quit smoking for 4 minutes and offered nicotine patch.   All the records are reviewed and case discussed with ED provider. Management plans discussed with the patient, family and they are in agreement.  CODE STATUS: full code.  Code Status History    Date Active Date Inactive Code Status Order ID Comments User Context   07/18/2017 18:54 07/20/2017 18:45 Full Code [id number]  Milagros LollSudini, Srikar, MD ED   04/27/2017 22:49 04/29/2017 00:13 Full Code [id number]  Oralia ManisWillis, David, MD Inpatient       TOTAL TIME TAKING CARE OF THIS PATIENT:50 minutes.    Altamese DillingVaibhavkumar Barak Bialecki M.D on 08/06/2017   Between 7am to 6pm - Pager - [phone]  After 6pm go to [email or web address] - Social research officer, [email or web address] [email or web address]  [facility]  Office  [phone]  CC: Primary care physician; Emogene MorganAycock, Ngwe A, MD   Note: This dictation was prepared with Dragon dictation along with smaller phrase technology. Any transcriptional errors that result from this process are unintentional.

## 2017-08-06 NOTE — ED Notes (Signed)
Floor unable to take report at this time.

## 2017-08-06 NOTE — ED Provider Notes (Signed)
[facility] Emergency Department Provider Note  ____________________________________________   First MD Initiated Contact with Patient 08/06/17 1857     (approximate)  I have reviewed the triage vital signs and the nursing notes.   HISTORY  Chief Complaint Numbness   HPI Desiree Carey is a 57 y.o. female with a history of CAD status post CABG and recent end STEMI who is presenting to the emergency department with bilateral hand tingling.  Says that she woke up at 3 AM earlier today with left hand tingling and then later in the day this progressed to right hand tingling but only really lateral fingers.  She denies any neck pain.  Denies any elbow pain.  Denies any shortness of breath or chest pain.  Says that she has presented very atypically for her cardiac disease in the past with just shortness of breath or weakness.  Says that she has been on Plavix.  3 weeks ago she had a catheterization with occlusion of her bypass grafts and the decision was made to treat her medically.  She says that the bilateral hand numbness has been aching and uncomfortable as well.   Past Medical History:  Diagnosis Date  . Anemia   . Anxiety   . Asthma   . CAD (coronary artery disease)    CABG 2012  . Depression   . Diabetes mellitus without complication (HCC)   . Diverticulitis   . Heart attack (HCC)   . Hyperlipemia   . Hypertension   . IBS (irritable bowel syndrome)   . Thyroid disease     Patient Active Problem List   Diagnosis Date Noted  . NSTEMI (non-ST elevated myocardial infarction) (HCC) 07/18/2017  . Diabetes (HCC) 04/27/2017  . Hypothyroidism 04/27/2017  . CAD (coronary artery disease) 04/27/2017  . Chest pain 04/27/2017  . HTN (hypertension) 04/27/2017  . HLD (hyperlipidemia) 04/27/2017  . AKI (acute kidney injury) (HCC) 04/27/2017    Past Surgical History:  Procedure Laterality Date  . ABDOMINAL HYSTERECTOMY    . CARDIAC SURGERY    . JOINT  REPLACEMENT    . LEFT HEART CATH AND CORONARY ANGIOGRAPHY N/A 07/19/2017   Procedure: LEFT HEART CATH AND CORONARY ANGIOGRAPHY and possible PCI;  Surgeon: Alwyn Pea, MD;  Location: [facility] INVASIVE CV LAB;  Service: Cardiovascular;  Laterality: N/A;  . SKIN GRAFT      Prior to Admission medications   Medication Sig Start Date End Date Taking? Authorizing Provider  aspirin EC 81 MG EC tablet Take 1 tablet (81 mg total) by mouth daily. 07/21/17  Yes Enedina Finner, MD  carvedilol (COREG CR) 40 MG 24 hr capsule Take 40 mg by mouth daily.   Yes [provider]  cloNIDine (CATAPRES) 0.1 MG tablet Take 0.1 mg by mouth 2 (two) times daily.    Yes [provider]  clopidogrel (PLAVIX) 75 MG tablet Take 1 tablet (75 mg total) by mouth daily. 07/21/17  Yes Enedina Finner, MD  DULoxetine (CYMBALTA) 30 MG capsule Take 30 mg by mouth daily.    Yes [provider]  gabapentin (NEURONTIN) 800 MG tablet Take 800 mg by mouth 4 (four) times daily.    Yes [provider]  hydrochlorothiazide (HYDRODIURIL) 25 MG tablet Take 25 mg by mouth daily.   Yes [provider]  insulin aspart (NOVOLOG) 100 UNIT/ML injection Inject 32 Units into the skin 3 (three) times daily with meals.    Yes [provider]  insulin detemir (LEVEMIR) 100  unit/ml SOLN Inject 80 Units into the skin at bedtime.   Yes [provider]  isosorbide mononitrate (IMDUR) 120 MG 24 hr tablet Take 1 tablet (120 mg total) by mouth daily. 07/20/17  Yes Enedina Finner, MD  levothyroxine (SYNTHROID, LEVOTHROID) 175 MCG tablet Take 175 mcg by mouth daily before breakfast.    Yes [provider]  lisinopril (PRINIVIL,ZESTRIL) 2.5 MG tablet Take 2.5 mg by mouth daily.    Yes [provider]  omeprazole (PRILOSEC) 40 MG capsule Take 40 mg by mouth daily.   Yes [provider]  potassium chloride SA (K-DUR,KLOR-CON) 20 MEQ tablet Take 20 mEq by mouth.   Yes [provider]    promethazine (PHENERGAN) 12.5 MG tablet Take 1 tablet (12.5 mg total) by mouth every 8 (eight) hours as needed for nausea or vomiting. 07/20/17  Yes Enedina Finner, MD  ranolazine (RANEXA) 500 MG 12 hr tablet Take 1 tablet (500 mg total) by mouth 2 (two) times daily. 07/20/17  Yes Enedina Finner, MD  simvastatin (ZOCOR) 40 MG tablet Take 40 mg by mouth daily.   Yes [provider]    Allergies Clindamycin/lincomycin; Hydrocodone bitartrate er; Lidocaine; Metformin and related; Oxycodone; Penicillins; Pioglitazone hcl-glimepiride; Ramipril; Sertraline hcl; Sitagliptin; Sulfur; and Ondansetron  Family History  Problem Relation Age of Onset  . Diabetes Mother   . Hypertension Mother   . Stroke Mother   . Heart attack Mother     Social History Social History   Tobacco Use  . Smoking status: Current Every Day Smoker    Packs/day: 0.50    Types: Cigarettes  . Smokeless tobacco: Never Used  Substance Use Topics  . Alcohol use: No    Frequency: Never  . Drug use: No    Review of Systems  Constitutional: No fever/chills Eyes: No visual changes. ENT: No sore throat. Cardiovascular: Denies chest pain. Respiratory: Denies shortness of breath. Gastrointestinal: No abdominal pain.  No nausea, no vomiting.  No diarrhea.  No constipation. Genitourinary: Negative for dysuria. Musculoskeletal: Says that she is ongoing burning back pain to the left medial thoracic back that is been there for months and has been constant. Skin: Negative for rash. Neurological: Negative for headaches, focal weakness or numbness.   ____________________________________________   PHYSICAL EXAM:  VITAL SIGNS: ED Triage Vitals  Enc Vitals Group     BP 08/06/17 1749 117/77     Pulse Rate 08/06/17 1749 100     Resp 08/06/17 1749 18     Temp 08/06/17 1749 98.4 F (36.9 C)     Temp Source 08/06/17 1749 Oral     SpO2 08/06/17 1749 100 %     Weight 08/06/17 1750 166 lb (75.3 kg)     Height 08/06/17 1750  5\' 7"  (1.702 m)     Head Circumference --      Peak Flow --      Pain Score 08/06/17 1749 10     Pain Loc --      Pain Edu? --      Excl. in GC? --     Constitutional: Alert and oriented. Well appearing and in no acute distress. Eyes: Conjunctivae are normal.  Head: Atraumatic. Nose: No congestion/rhinnorhea. Mouth/Throat: Mucous membranes are moist.  Neck: No stridor.   Cardiovascular: Normal rate, regular rhythm. Grossly normal heart sounds.  Good peripheral circulation with equal and bilateral radial pulses.  Respiratory: Normal respiratory effort.  No retractions. Lungs CTAB. Gastrointestinal: Soft and nontender. No distention. No CVA  tenderness. Musculoskeletal: No lower extremity tenderness nor edema.  No joint effusions. Neurologic:  Normal speech and language.   Patient with dull sensation to the entire left hand especially to the fingers.  Also with dulled sensation to the right hand, medial 3 fingers.  No elbow tenderness.  No weakness to the bilateral upper extremities.  Skin:  Skin is warm, dry and intact. No rash noted. Psychiatric: Mood and affect are normal. Speech and behavior are normal.  ____________________________________________   LABS (all labs ordered are listed, but only abnormal results are displayed)  Labs Reviewed  COMPREHENSIVE METABOLIC PANEL - Abnormal; Notable for the following components:      Result Value   Sodium 133 (*)    Chloride 99 (*)    Glucose, Bld 471 (*)    Creatinine, Ser 1.16 (*)    Calcium 8.7 (*)    ALT 10 (*)    GFR calc non Af Amer 52 (*)    All other components within normal limits  TROPONIN I - Abnormal; Notable for the following components:   Troponin I 0.72 (*)    All other components within normal limits  CBC WITH DIFFERENTIAL/PLATELET  TROPONIN I   ____________________________________________  EKG  ED ECG REPORT I, Arelia Longestavid M Jennesis Ramaswamy, the attending physician, personally viewed and interpreted this ECG.    Date: 08/06/2017  EKG Time: 1759  Rate: 96  Rhythm: normal sinus rhythm  Axis: Normal  Intervals:LVH with repolarization abnormality, prolonged QT  ST&T Change: Minimal ST depression in V5 and V6 which is unchanged from previous also with T wave inversions in V5 and V6.  Unchanged from previous.  ____________________________________________  RADIOLOGY   ____________________________________________   PROCEDURES  Procedure(s) performed:   Procedures  Critical Care performed:   ____________________________________________   INITIAL IMPRESSION / ASSESSMENT AND PLAN / ED COURSE  Pertinent labs & imaging results that were available during my care of the patient were reviewed by me and considered in my medical decision making (see chart for details).  Differential diagnosis includes, but is not limited to, ACS, aortic dissection, pulmonary embolism, cardiac tamponade, pneumothorax, pneumonia, pericarditis, myocarditis, GI-related causes including esophagitis/gastritis, and musculoskeletal chest wall pain.   As part of my medical decision making, I reviewed the following data within the electronic MEDICAL RECORD NUMBER Old chart reviewed  ----------------------------------------- 8:40 PM on 08/06/2017 -----------------------------------------  I discussed the case with Dr. Gwen PoundsKowalski.  The patient's troponins likely have resolved to normal by now after the NSTEMI.  The patient will be observed overnight.  We will trend troponins.  Patient given morphine.  Signed out to Dr. Elisabeth PigeonVachhani.  Patient aware of need for admission to the hospital.      ____________________________________________   FINAL CLINICAL IMPRESSION(S) / ED DIAGNOSES  Paresthesia.  Elevated troponin.     NEW MEDICATIONS STARTED DURING THIS VISIT:  New Prescriptions   No medications on file     Note:  This document was prepared using Dragon voice recognition software and may include unintentional dictation  errors.     Myrna BlazerSchaevitz, Garrette Caine Matthew, MD 08/06/17 [phone]

## 2017-08-06 NOTE — ED Notes (Signed)
Pt eating sub brought by pt's sister at this time, pt is in NAD at this time and is using both hands without difficulty noted.

## 2017-08-06 NOTE — ED Triage Notes (Addendum)
C/O left hand numbness and right fourth and fifth finger numbness.  States she woke up this morning with symptoms.  Went to bed last night at around 2000 feeling well.    AAOx3.  Skin warm and dry.  MAE equally and strong.  Speech clear.  NAD

## 2017-08-06 NOTE — ED Notes (Signed)
Patient transported to MRI Floor RN notified of this

## 2017-08-06 NOTE — Progress Notes (Signed)
ANTICOAGULATION CONSULT NOTE - Initial Consult  Pharmacy Consult for Heparin Indication: chest pain/ACS  Allergies  Allergen Reactions  . Clindamycin/Lincomycin Other (See Comments)    Thrush   . Hydrocodone Bitartrate Er Itching  . Lidocaine     unknown  . Metformin And Related Other (See Comments)    Fatigue   . Oxycodone Other (See Comments)    Hallucination   . Penicillins Hives    Has patient had a PCN reaction causing immediate rash, facial/tongue/throat swelling, SOB or lightheadedness with hypotension: No Has patient had a PCN reaction causing severe rash involving mucus membranes or skin necrosis: No Has patient had a PCN reaction that required hospitalization: No Has patient had a PCN reaction occurring within the last 10 years: No If all of the above answers are "NO", then may proceed with Cephalosporin use.   . Pioglitazone Hcl-Glimepiride Hives  . Ramipril Swelling  . Sertraline Hcl Other (See Comments)    Hallucination   . Sitagliptin Other (See Comments)    Headache, chest pain  . Sulfur     unknown  . Ondansetron Rash    Patient Measurements: Height: 5\' 7"  (170.2 cm) Weight: 166 lb (75.3 kg) IBW/kg (Calculated) : 61.6  Vital Signs: Temp: 98.4 F (36.9 C) (02/25 1749) Temp Source: Oral (02/25 1749) BP: 140/64 (02/25 2034) Pulse Rate: 90 (02/25 2035)  Labs: Recent Labs    08/06/17 1754  HGB 12.5  HCT 36.8  PLT 281  CREATININE 1.16*  TROPONINI 0.72*    Estimated Creatinine Clearance: 58.7 mL/min (A) (by C-G formula based on SCr of 1.16 mg/dL (H)).   Medical History: Past Medical History:  Diagnosis Date  . Anemia   . Anxiety   . Asthma   . CAD (coronary artery disease)    CABG 2012  . Depression   . Diabetes mellitus without complication (HCC)   . Diverticulitis   . Heart attack (HCC)   . Hyperlipemia   . Hypertension   . IBS (irritable bowel syndrome)   . Thyroid disease     Assessment: 57 y/o F with a known h/o CAD not  on anticoagulants ordered heparin drip for r/o ACS.    Goal of Therapy:  Heparin level 0.3-0.7 units/ml Monitor platelets by anticoagulation protocol: Yes   Plan:  Give 4000units bolus x 1 Start heparin infusion at 900 units/hr Check anti-Xa level in 6 hours and daily while on heparin Continue to monitor H&H and platelets  Luisa HartChristy, Mardee Clune D 08/06/2017,8:48 PM

## 2017-08-06 NOTE — ED Notes (Signed)
MRI on phone with pt.

## 2017-08-06 NOTE — ED Notes (Signed)
Pt's yellow colored necklace and necklace, one gray colored bracelet and one gray and yellow colored bracelet placed in bag and placed in pt's belonging bag

## 2017-08-06 NOTE — ED Notes (Signed)
Pt given sandwich tray per EDP

## 2017-08-07 ENCOUNTER — Observation Stay: Payer: Medicaid Other

## 2017-08-07 LAB — BASIC METABOLIC PANEL
Anion gap: 8 (ref 5–15)
BUN: 16 mg/dL (ref 6–20)
CHLORIDE: 102 mmol/L (ref 101–111)
CO2: 24 mmol/L (ref 22–32)
Calcium: 8.4 mg/dL — ABNORMAL LOW (ref 8.9–10.3)
Creatinine, Ser: 1.07 mg/dL — ABNORMAL HIGH (ref 0.44–1.00)
GFR calc Af Amer: >60 mL/min (ref >60)
GFR calc non Af Amer: 58 mL/min — ABNORMAL LOW (ref >60)
Glucose, Bld: 420 mg/dL — ABNORMAL HIGH (ref 65–99)
POTASSIUM: 3.7 mmol/L (ref 3.5–5.1)
SODIUM: 134 mmol/L — AB (ref 135–145)

## 2017-08-07 LAB — GLUCOSE, CAPILLARY
Glucose-Capillary: 302 mg/dL — ABNORMAL HIGH (ref 65–99)
Glucose-Capillary: 183 mg/dL — ABNORMAL HIGH (ref 65–99)
Glucose-Capillary: 77 mg/dL (ref 65–99)
Glucose-Capillary: 107 mg/dL — ABNORMAL HIGH (ref 65–99)

## 2017-08-07 LAB — LIPID PANEL
Cholesterol: 127 mg/dL (ref 0–200)
HDL: 27 mg/dL — AB (ref >40)
LDL Cholesterol: 62 mg/dL (ref 0–99)
Total CHOL/HDL Ratio: 4.7 RATIO
Triglycerides: 188 mg/dL — ABNORMAL HIGH (ref <150)
VLDL: 38 mg/dL (ref 0–40)

## 2017-08-07 LAB — CBC
HEMATOCRIT: 34 % — AB (ref 35.0–47.0)
Hemoglobin: 11.7 g/dL — ABNORMAL LOW (ref 12.0–16.0)
MCH: 28.3 pg (ref 26.0–34.0)
MCHC: 34.4 g/dL (ref 32.0–36.0)
MCV: 82.4 fL (ref 80.0–100.0)
Platelets: 249 10*3/uL (ref 150–440)
RBC: 4.13 MIL/uL (ref 3.80–5.20)
RDW: 14.5 % (ref 11.5–14.5)
WBC: 7.8 10*3/uL (ref 3.6–11.0)

## 2017-08-07 LAB — TROPONIN I
TROPONIN I: 0.61 ng/mL — AB (ref <0.03)
TROPONIN I: 1.07 ng/mL — AB (ref <0.03)

## 2017-08-07 LAB — HEPARIN LEVEL (UNFRACTIONATED): Heparin Unfractionated: 0.23 IU/mL — ABNORMAL LOW (ref 0.30–0.70)

## 2017-08-07 MED ORDER — INSULIN ASPART 100 UNIT/ML ~~LOC~~ SOLN
0.0000 [IU] | Freq: Three times a day (TID) | SUBCUTANEOUS | Status: DC
  Administered 2017-08-07: 3 [IU] via SUBCUTANEOUS
  Administered 2017-08-07: 11 [IU] via SUBCUTANEOUS
  Administered 2017-08-08: 2 [IU] via SUBCUTANEOUS
  Filled 2017-08-07 (×2): qty 1

## 2017-08-07 MED ORDER — INSULIN ASPART 100 UNIT/ML ~~LOC~~ SOLN
SUBCUTANEOUS | Status: AC
  Filled 2017-08-07: qty 1

## 2017-08-07 MED ORDER — HEPARIN BOLUS VIA INFUSION
1000.0000 [IU] | Freq: Once | INTRAVENOUS | Status: AC
  Administered 2017-08-07: 08:00:00 1000 [IU] via INTRAVENOUS
  Filled 2017-08-07: qty 1000

## 2017-08-07 MED ORDER — MORPHINE SULFATE (PF) 4 MG/ML IV SOLN: 4.0000 mg | INTRAVENOUS | Status: DC | PRN

## 2017-08-07 MED ORDER — INSULIN ASPART 100 UNIT/ML ~~LOC~~ SOLN: 0.0000 [IU] | Freq: Every day | SUBCUTANEOUS | Status: DC

## 2017-08-07 MED ORDER — INSULIN ASPART 100 UNIT/ML ~~LOC~~ SOLN
5.0000 [IU] | Freq: Three times a day (TID) | SUBCUTANEOUS | Status: DC
  Administered 2017-08-07 – 2017-08-08 (×2): 5 [IU] via SUBCUTANEOUS
  Filled 2017-08-07 (×3): qty 1

## 2017-08-07 MED ORDER — SODIUM CHLORIDE 0.9 % IV BOLUS (SEPSIS)
500.0000 mL | Freq: Once | INTRAVENOUS | Status: AC
  Administered 2017-08-07: 20:00:00 500 mL via INTRAVENOUS

## 2017-08-07 MED ORDER — LIVING WELL WITH DIABETES BOOK
Freq: Once | Status: AC
  Administered 2017-08-07: 10:00:00
  Filled 2017-08-07: qty 1

## 2017-08-07 MED ORDER — GABAPENTIN 300 MG PO CAPS
800.0000 mg | ORAL_CAPSULE | Freq: Four times a day (QID) | ORAL | Status: DC
  Administered 2017-08-07 – 2017-08-08 (×4): 800 mg via ORAL
  Filled 2017-08-07 (×4): qty 2

## 2017-08-07 MED ORDER — GABAPENTIN 300 MG PO CAPS: 800.0000 mg | ORAL_CAPSULE | Freq: Four times a day (QID) | ORAL | Status: DC

## 2017-08-07 MED ORDER — MORPHINE SULFATE (PF) 4 MG/ML IV SOLN
4.0000 mg | INTRAVENOUS | Status: DC | PRN
  Administered 2017-08-07: 10:00:00 4 mg via INTRAVENOUS
  Filled 2017-08-07 (×2): qty 1

## 2017-08-07 MED ORDER — TRAMADOL HCL 50 MG PO TABS
50.0000 mg | ORAL_TABLET | Freq: Four times a day (QID) | ORAL | Status: DC | PRN
  Administered 2017-08-07 – 2017-08-08 (×3): 50 mg via ORAL
  Filled 2017-08-07 (×3): qty 1

## 2017-08-07 MED ORDER — INSULIN DETEMIR 100 UNIT/ML ~~LOC~~ SOLN
65.0000 [IU] | Freq: Every day | SUBCUTANEOUS | Status: DC
  Administered 2017-08-07 – 2017-08-08 (×2): 65 [IU] via SUBCUTANEOUS
  Filled 2017-08-07 (×2): qty 0.65

## 2017-08-07 NOTE — Progress Notes (Addendum)
[facility] [place] [facility]   PATIENT NAME: Desiree RiegerCharlotte Carey    MR#:  [id number]  DATE OF BIRTH:  May 23, 1961  SUBJECTIVE:  CHIEF COMPLAINT:  feeling tired and reports left hand tingling and numbness associated with weakness are slightly better but now reporting right hand numbness.  Denies any chest pain or shortness of breath  REVIEW OF SYSTEMS:  CONSTITUTIONAL: No fever, fatigue or weakness.  EYES: No blurred or double vision.  EARS, NOSE, AND THROAT: No tinnitus or ear pain.  RESPIRATORY: No cough, shortness of breath, wheezing or hemoptysis.  CARDIOVASCULAR: No chest pain, orthopnea, edema.  GASTROINTESTINAL: No nausea, vomiting, diarrhea or abdominal pain.  GENITOURINARY: No dysuria, hematuria.  ENDOCRINE: No polyuria, nocturia,  HEMATOLOGY: No anemia, easy bruising or bleeding SKIN: No rash or lesion. MUSCULOSKELETAL: No joint pain or arthritis.   NEUROLOGIC: No tingling, numbness, weakness.  PSYCHIATRY: No anxiety or depression.   DRUG ALLERGIES:   Allergies  Allergen Reactions  . Clindamycin/Lincomycin Other (See Comments)    Thrush   . Hydrocodone Bitartrate Er Itching  . Lidocaine     unknown  . Metformin And Related Other (See Comments)    Fatigue   . Oxycodone Other (See Comments)    Hallucination   . Penicillins Hives    Has patient had a PCN reaction causing immediate rash, facial/tongue/throat swelling, SOB or lightheadedness with hypotension: No Has patient had a PCN reaction causing severe rash involving mucus membranes or skin necrosis: No Has patient had a PCN reaction that required hospitalization: No Has patient had a PCN reaction occurring within the last 10 years: No If all of the above answers are "NO", then may proceed with Cephalosporin use.   . Pioglitazone Hcl-Glimepiride Hives  . Ramipril Swelling  . Sertraline Hcl Other (See Comments)    Hallucination   . Sitagliptin Other (See Comments)    Headache,  chest pain  . Sulfur     unknown  . Ondansetron Rash    VITALS:  Blood pressure (!) 94/54, pulse 80, temperature 98.1 F (36.7 C), temperature source Oral, resp. rate 20, height 5\' 7"  (1.702 m), weight 75.3 kg (166 lb), SpO2 94 %.  PHYSICAL EXAMINATION:  GENERAL:  57 y.o.-year-old patient lying in the bed with no acute distress.  EYES: Pupils equal, round, reactive to light and accommodation. No scleral icterus. Extraocular muscles intact.  HEENT: Head atraumatic, normocephalic. Oropharynx and nasopharynx clear.  NECK:  Supple, no jugular venous distention. No thyroid enlargement, no tenderness.  LUNGS: Normal breath sounds bilaterally, no wheezing, rales,rhonchi or crepitation. No use of accessory muscles of respiration.  CARDIOVASCULAR: S1, S2 normal. No murmurs, rubs, or gallops.  ABDOMEN: Soft, nontender, nondistended. Bowel sounds present. No organomegaly or mass.  EXTREMITIES: No pedal edema, cyanosis, or clubbing.  NEUROLOGIC: Cranial nerves II through XII are intact. Muscle strength 5/5 in all extremities. Sensation intact. Gait not checked.  PSYCHIATRIC: The patient is alert and oriented x 3.  SKIN: No obvious rash, lesion, or ulcer.    LABORATORY PANEL:   CBC Recent Labs  Lab 08/07/17 0357  WBC 7.8  HGB 11.7*  HCT 34.0*  PLT 249   ------------------------------------------------------------------------------------------------------------------  Chemistries  Recent Labs  Lab 08/06/17 1754 08/07/17 0357  NA 133* 134*  K 3.7 3.7  CL 99* 102  CO2 24 24  GLUCOSE 471* 420*  BUN 17 16  CREATININE 1.16* 1.07*  CALCIUM 8.7* 8.4*  AST 16  --   ALT 10*  --  ALKPHOS 62  --   BILITOT 0.3  --    ------------------------------------------------------------------------------------------------------------------  Cardiac Enzymes Recent Labs  Lab 08/07/17 0357  TROPONINI 1.07*    ------------------------------------------------------------------------------------------------------------------  RADIOLOGY:  Ct Head Wo Contrast  Result Date: 08/06/2017 CLINICAL DATA:  Left hand numbness EXAM: CT HEAD WITHOUT CONTRAST TECHNIQUE: Contiguous axial images were obtained from the base of the skull through the vertex without intravenous contrast. COMPARISON:  None. FINDINGS: Brain: No acute territorial infarction, hemorrhage or intracranial mass is visualized. Mild small vessel ischemic changes of the white matter. More focal hypodensity within the right frontal white matter and basal ganglia probably reflects remote infarct. Mild ex vacuo dilatation of the right anterior ventricle. Ventricles are nonenlarged. Vascular: No hyperdense vessels. Vertebral artery calcification. Carotid artery calcification Skull: Normal. Negative for fracture or focal lesion. Sinuses/Orbits: No acute finding. Other: None IMPRESSION: 1. No definite CT evidence for acute intracranial abnormality 2. Mild small vessel ischemic changes of the white matter. Electronically Signed   By: Jasmine Pang M.D.   On: 08/06/2017 20:18   Mr Brain Wo Contrast  Result Date: 08/06/2017 CLINICAL DATA:  57 y/o F; oval with left hand numbness, numbness and right hand fingers beginning at approximately 1600 hours. EXAM: MRI HEAD WITHOUT CONTRAST TECHNIQUE: Multiplanar, multiecho pulse sequences of the brain and surrounding structures were obtained without intravenous contrast. COMPARISON:  08/06/2017 CT head. FINDINGS: Brain: No acute infarction, hemorrhage, hydrocephalus, extra-axial collection or mass lesion. Small chronic infarction extending from right lentiform nucleus into frontal periventricular white matter. Small chronic lacunar infarctions within left thalamus and left mid corona radiata. Fewnonspecific foci of T2 FLAIR hyperintense signal abnormality in subcortical and periventricular white matter are compatible  withmildchronic microvascular ischemic changes for age. Mildbrain parenchymal volume loss. Vascular: Normal flow voids. Skull and upper cervical spine: Normal marrow signal. Sinuses/Orbits: Negative. Other: None. IMPRESSION: 1. No acute intracranial abnormality identified. 2. Mild chronic microvascular ischemic changes and mild parenchymal volume loss of the brain. 3. 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 Electronically Signed   By: Mitzi Hansen M.D.   On: 08/06/2017 23:57    EKG:   Orders placed or performed during the hospital encounter of 08/06/17  . ED EKG  . ED EKG    ASSESSMENT AND PLAN:    * TIA with left forearm and hand numbness Clinically improving CT head and MRI of the brain are negative  carotid Dopplers Patient had a recent echocardiogram 2-3 months ago, no need to repeat Lipid panel Continue antiplatelets and statins     * Elevated troponin Troponin 0 0.72-0.66-1.07 Patient is a symptomatic   Most likely this is continuation from 2 weeks ago when troponin was much higher     She is on optimal cardiac medication after recent non-ST elevation MI and catheterization. Cardiology discontinued heparin drip, plan is to ambulate the patient and monitor clinically Will discharge in am if  stable  * Hypertension   Continue home medication, stable.  * Diabetes   Continue basal insulin and keep on sliding-scale coverage.  * Hyperlipidemia   Continue simvastatin. LDL 62   * Hypothyroidism   Continue levothyroxine.  * Active smoking   Counseled to quit smoking for 4 minutes and offered nicotine patch.      All the records are reviewed and case discussed with Care Management/Social Workerr. Management plans discussed with the patient, family and they  are in agreement.  CODE STATUS: fc  TOTAL TIME TAKING CARE OF  THIS PATIENT:  36  minutes.   POSSIBLE D/C IN am  DAYS, DEPENDING ON CLINICAL CONDITION.  Note: This dictation was prepared with Dragon dictation along with smaller phrase technology. Any transcriptional errors that result from this process are unintentional.   Ramonita Lab M.D on 08/07/2017 at 3:29 PM  Between 7am to 6pm - Pager - [phone] After 6pm go to [email or web address] - password [email or web address] [facility]  [facility]  Office  [phone]  CC: Primary care physician; Emogene Morgan, MD

## 2017-08-07 NOTE — Progress Notes (Addendum)
Inpatient Diabetes Program Recommendations  AACE/ADA: New Consensus Statement on Inpatient Glycemic Control (2015)  Target Ranges:  Prepandial:   less than 140 mg/dL      Peak postprandial:   less than 180 mg/dL (1-2 hours)      Critically ill patients:  140 - 180 mg/dL  Results for [name], Marishka (MRN [id number]) as of 08/07/2017 08:11  Ref. Range 08/06/2017 17:54 08/07/2017 03:57  Glucose Latest Ref Range: 65 - 99 mg/dL 409471 (H) 811420 (H)    Review of Glycemic Control  Diabetes history: DM2 Outpatient Diabetes medications: Levemir 80 units QHS, Novolog 32 units TID with meals Current orders for Inpatient glycemic control: Levemir 80 units QHS  Inpatient Diabetes Program Recommendations: Insulin - Basal: Noted (per chart review) patient refused Levemir last night. Glucose 420 mg/dl this morning. Please consider discontinuing current Levemir order and ordering Levemir 65 units Q24H starting now. Correction (SSI): Please consider ordering CBGs ACHS with Novolog 0-15 units TID with meals and Novolog 0-5 units QHS. Insulin - Meal Coverage: Please consider ordering Novolog 5 units TID with meals for meal coverage if patient eats at least 50% of meals. HgbA1C: A1C 10.7% on 07/18/17 indicating an average glucose of 260 mg/dl. Patient was seen by an inpatient Diabetes Coordinator on 07/20/17 during last admission. Patient states her PCP referred her to an Endocrinologist for assistance with improving DM control and her appointment is August 15, 2017. Outpatient DM medications: At time of discharge, please consider adjusting outpatient insulin dosages if patient has fair glycemic control with inpatient insulin orders.   NOTE: Noted consult for Diabetes Coordinator for hyperglycemia and patient report of not taking insulin in 1 month. Will plan to talk with patient today.  Addendum 08/07/17@12 :30-Spoke with patient about diabetes and home regimen for diabetes control. Patient reports that she is followed by  PCP for diabetes management and her PCP has referred her to an Endocrinologist (her initial appointment is August 15, 2017).  Patient reports that she has Medicaid and she has insulin and testing supplies at home and is able to get what she needs without any issues. Inquired about DM medications she is currently taking and she reports Levemir and Novolog but can not state exact dose of them. Patient admits that she has not taken insulin much at all since she was discharged from the hospital on 07/20/17. Inquired about why and patient reports that she use to take Lantus and Novolog and now she has Levemir and Novolog (PCP changed basal insulin). She states that she experienced a low since being on Levemir, she has lost a considerable amount of weight, and she feels like the insulin dosages need to be changed. Patient reports that she has not been checking her glucose at home. She recently got a Insurance risk surveyorDario glucometer and she is in the process of trying to get her insurance to cover a [employer] Libre (flash glucose monitoring sensor; 14 day wear). Patient admits that she hates to stick her finger to check her glucose and she hopes that she will be able to get the [facility] Libre as it does not require any finger sticks.  Explained to patient that she needs to be consistently monitoring glucose, taking insulin as prescribed, and keeping her doctor informed of glycemic control. Discussed A1C results (10.7% on 07/18/17) and explained that her current A1C indicates an average glucose of 260 mg/dl. Discussed glucose and A1C goals. Discussed importance of checking CBGs and maintaining good CBG control to prevent long-term and short-term complications. Explained  how hyperglycemia leads to damage within blood vessels which lead to the common complications seen with uncontrolled diabetes. Stressed to the patient the importance of improving glycemic control to prevent further complications from uncontrolled diabetes. Discussed  complications in more detail and explained that if she does not improve DM control she is increasing her risk of developing complications. Discussed impact of nutrition, exercise, stress, sickness, and medications on diabetes control.Patient reports that her family stresses her and she worries about them.  Informed patient she has Living Well with Diabetes book at bedside. Encouraged patient to read entire book. Encouraged patient to check her glucose 3-4 times per day (before meals and at bedtime) and to keep a log book of glucose readings and insulin taken which she will need to take to doctor appointments. Explained how her doctor can use the log book to continue to make insulin adjustments if needed.Also encouraged patient to keep appointment with Endocrinologist for assistance with improve glycemic control.  Patient verbalized understanding of information discussed and she states that she has no further questions at this time related to diabetes.  Thanks, Orlando Penner, RN, MSN, CDE Diabetes Coordinator Inpatient Diabetes Program [phone] (Team Pager from 8am to 5pm)

## 2017-08-07 NOTE — Consult Note (Signed)
[facility] Consultation [facility]  Patient ID: Desiree RiegerCharlotte Schoeller, MRN: [id number], DOB/AGE: [birth year] 57 y.o. Admit date: 08/06/2017   Date of Consult: 08/07/2017 Primary Physician: Emogene MorganAycock, Ngwe A, MD Primary Cardiologist: Call would  Chief Complaint:  Chief Complaint  Patient presents with  . Numbness   Reason for Consult: Beta troponin  HPI: 57 y.o. female with known coronary artery disease status post history of coronary artery bypass graft with a right internal mammary to an obtuse marginal and a left internal mammary to the left anterior descending artery.  The patient has had a recent non-ST elevation myocardial infarction for which the patient received medical management due to an occlusion of apparent graft to the right.  The patient had no evidence of potential for further PCI and stent placement.  The patient was discharged home on appropriate medications including dual antiplatelet therapy antihypertensive medication management and lipid management.  The patient continues on diabetes treatment and has done fairly well from a cardiovascular standpoint with no further evidence of anginal symptoms or congestive heart failure.  She became confused yesterday and had other neurologic issues for which she has had no further resolution at this time but there was concern of a abnormal troponin level going from 0.67-1.07.  This is potentially consistent with non-ST elevation myocardial infarction.  She has been on appropriate heparin for 24 hours and other medication management listed above and has had no further symptoms.  Past Medical History:  Diagnosis Date  . Anemia   . Anxiety   . Asthma   . CAD (coronary artery disease)    CABG 2012  . Depression   . Diabetes mellitus without complication (HCC)   . Diverticulitis   . Heart attack (HCC)   . Hyperlipemia   . Hypertension   . IBS (irritable bowel syndrome)   . Thyroid disease       Surgical History:  Past Surgical  History:  Procedure Laterality Date  . ABDOMINAL HYSTERECTOMY    . CARDIAC SURGERY    . JOINT REPLACEMENT    . LEFT HEART CATH AND CORONARY ANGIOGRAPHY N/A 07/19/2017   Procedure: LEFT HEART CATH AND CORONARY ANGIOGRAPHY and possible PCI;  Surgeon: Alwyn Peaallwood, Dwayne D, MD;  Location: [facility] INVASIVE CV LAB;  Service: Cardiovascular;  Laterality: N/A;  . SKIN GRAFT       Home Meds: Prior to Admission medications   Medication Sig Start Date End Date Taking? Authorizing Provider  aspirin EC 81 MG EC tablet Take 1 tablet (81 mg total) by mouth daily. 07/21/17  Yes Enedina FinnerPatel, Sona, MD  carvedilol (COREG CR) 40 MG 24 hr capsule Take 40 mg by mouth daily.   Yes [provider]  cloNIDine (CATAPRES) 0.1 MG tablet Take 0.1 mg by mouth 2 (two) times daily.    Yes [provider]  clopidogrel (PLAVIX) 75 MG tablet Take 1 tablet (75 mg total) by mouth daily. 07/21/17  Yes Enedina FinnerPatel, Sona, MD  DULoxetine (CYMBALTA) 30 MG capsule Take 30 mg by mouth daily.    Yes [provider]  gabapentin (NEURONTIN) 800 MG tablet Take 800 mg by mouth 4 (four) times daily.    Yes [provider]  hydrochlorothiazide (HYDRODIURIL) 25 MG tablet Take 25 mg by mouth daily.   Yes [provider]  insulin aspart (NOVOLOG) 100 UNIT/ML injection Inject 32 Units into the skin 3 (three) times daily with meals.    Yes [provider]  insulin detemir (LEVEMIR) 100 unit/ml SOLN Inject 80  Units into the skin at bedtime.   Yes [provider]  isosorbide mononitrate (IMDUR) 120 MG 24 hr tablet Take 1 tablet (120 mg total) by mouth daily. 07/20/17  Yes Enedina Finner, MD  levothyroxine (SYNTHROID, LEVOTHROID) 175 MCG tablet Take 175 mcg by mouth daily before breakfast.    Yes [provider]  lisinopril (PRINIVIL,ZESTRIL) 2.5 MG tablet Take 2.5 mg by mouth daily.    Yes [provider]  omeprazole (PRILOSEC) 40 MG capsule Take 40 mg by mouth daily.   Yes [provider]  potassium chloride SA (K-DUR,KLOR-CON) 20 MEQ tablet Take 20 mEq by mouth.   Yes [provider]  promethazine (PHENERGAN) 12.5 MG tablet Take 1 tablet (12.5 mg total) by mouth every 8 (eight) hours as needed for nausea or vomiting. 07/20/17  Yes Enedina Finner, MD  ranolazine (RANEXA) 500 MG 12 hr tablet Take 1 tablet (500 mg total) by mouth 2 (two) times daily. 07/20/17  Yes Enedina Finner, MD  simvastatin (ZOCOR) 40 MG tablet Take 40 mg by mouth daily.   Yes [provider]    Inpatient Medications:  . aspirin EC  81 mg Oral Daily  . carvedilol  40 mg Oral Daily  . cloNIDine  0.1 mg Oral BID  . clopidogrel  75 mg Oral Daily  . DULoxetine  30 mg Oral Daily  . gabapentin  800 mg Oral QID  . hydrochlorothiazide  25 mg Oral Daily  . insulin aspart      . insulin aspart  0-15 Units Subcutaneous TID WC  . insulin aspart  0-5 Units Subcutaneous QHS  . insulin aspart  5 Units Subcutaneous TID WC  . insulin detemir  65 Units Subcutaneous Daily  . isosorbide mononitrate  120 mg Oral Daily  . levothyroxine  175 mcg Oral QAC breakfast  . lisinopril  2.5 mg Oral Daily  . pantoprazole  40 mg Oral Daily  . ranolazine  500 mg Oral BID  . simvastatin  40 mg Oral Daily     Allergies:  Allergies  Allergen Reactions  . Clindamycin/Lincomycin Other (See Comments)    Thrush   . Hydrocodone Bitartrate Er Itching  . Lidocaine     unknown  . Metformin And Related Other (See Comments)    Fatigue   . Oxycodone Other (See Comments)    Hallucination   . Penicillins Hives    Has patient had a PCN reaction causing immediate rash, facial/tongue/throat swelling, SOB or lightheadedness with hypotension: No Has patient had a PCN reaction causing severe rash involving mucus membranes or skin necrosis: No Has patient had a PCN reaction that required hospitalization: No Has patient had a PCN reaction occurring within the last 10 years: No If all of the above answers are "NO", then may  proceed with Cephalosporin use.   . Pioglitazone Hcl-Glimepiride Hives  . Ramipril Swelling  . Sertraline Hcl Other (See Comments)    Hallucination   . Sitagliptin Other (See Comments)    Headache, chest pain  . Sulfur     unknown  . Ondansetron Rash    Social History   Socioeconomic History  . Marital status: Divorced    Spouse name: Not on file  . Number of children: Not on file  . Years of education: Not on file  . Highest education level: Not on file  Social Needs  . Financial resource strain: Not on file  . Food insecurity - worry: Not on file  . Food insecurity -  inability: Not on file  . Transportation needs - medical: Not on file  . Transportation needs - non-medical: Not on file  Occupational History  . Not on file  Tobacco Use  . Smoking status: Current Every Day Smoker    Packs/day: 0.50    Types: Cigarettes  . Smokeless tobacco: Never Used  Substance and Sexual Activity  . Alcohol use: No    Frequency: Never  . Drug use: No  . Sexual activity: Not on file  Other Topics Concern  . Not on file  Social History Narrative  . Not on file     Family History  Problem Relation Age of Onset  . Diabetes Mother   . Hypertension Mother   . Stroke Mother   . Heart attack Mother      Review of Systems Positive for neurologic issues Negative for: General:  chills, fever, night sweats or weight changes.  Cardiovascular: PND orthopnea syncope dizziness  Dermatological skin lesions rashes Respiratory: Cough congestion Urologic: Frequent urination urination at night and hematuria Abdominal: negative for nausea, vomiting, diarrhea, bright red blood per rectum, melena, or hematemesis Neurologic: negative for visual changes, and/or hearing changes  All other systems reviewed and are otherwise negative except as noted above.  Labs: Recent Labs    08/06/17 1754 08/06/17 2036 08/07/17 0205 08/07/17 0357  TROPONINI 0.72* 0.66* 0.61* 1.07*   Lab Results   Component Value Date   WBC 7.8 08/07/2017   HGB 11.7 (L) 08/07/2017   HCT 34.0 (L) 08/07/2017   MCV 82.4 08/07/2017   PLT 249 08/07/2017    Recent Labs  Lab 08/06/17 1754 08/07/17 0357  NA 133* 134*  K 3.7 3.7  CL 99* 102  CO2 24 24  BUN 17 16  CREATININE 1.16* 1.07*  CALCIUM 8.7* 8.4*  PROT 7.2  --   BILITOT 0.3  --   ALKPHOS 62  --   ALT 10*  --   AST 16  --   GLUCOSE 471* 420*   Lab Results  Component Value Date   CHOL 127 08/07/2017   HDL 27 (L) 08/07/2017   LDLCALC 62 08/07/2017   TRIG 188 (H) 08/07/2017   No results found for: DDIMER  Radiology/Studies:  Ct Head Wo Contrast  Result Date: 08/06/2017 CLINICAL DATA:  Left hand numbness EXAM: CT HEAD WITHOUT CONTRAST TECHNIQUE: Contiguous axial images were obtained from the base of the skull through the vertex without intravenous contrast. COMPARISON:  None. FINDINGS: Brain: No acute territorial infarction, hemorrhage or intracranial mass is visualized. Mild small vessel ischemic changes of the white matter. More focal hypodensity within the right frontal white matter and basal ganglia probably reflects remote infarct. Mild ex vacuo dilatation of the right anterior ventricle. Ventricles are nonenlarged. Vascular: No hyperdense vessels. Vertebral artery calcification. Carotid artery calcification Skull: Normal. Negative for fracture or focal lesion. Sinuses/Orbits: No acute finding. Other: None IMPRESSION: 1. No definite CT evidence for acute intracranial abnormality 2. Mild small vessel ischemic changes of the white matter. Electronically Signed   By: Jasmine Pang M.D.   On: 08/06/2017 20:18   Mr Brain Wo Contrast  Result Date: 08/06/2017 CLINICAL DATA:  57 y/o F; oval with left hand numbness, numbness and right hand fingers beginning at approximately 1600 hours. EXAM: MRI HEAD WITHOUT CONTRAST TECHNIQUE: Multiplanar, multiecho pulse sequences of the brain and surrounding structures were obtained without intravenous  contrast. COMPARISON:  08/06/2017 CT head. FINDINGS: Brain: No acute infarction, hemorrhage, hydrocephalus, extra-axial collection or mass  lesion. Small chronic infarction extending from right lentiform nucleus into frontal periventricular white matter. Small chronic lacunar infarctions within left thalamus and left mid corona radiata. Fewnonspecific foci of T2 FLAIR hyperintense signal abnormality in subcortical and periventricular white matter are compatible withmildchronic microvascular ischemic changes for age. Mildbrain parenchymal volume loss. Vascular: Normal flow voids. Skull and upper cervical spine: Normal marrow signal. Sinuses/Orbits: Negative. Other: None. IMPRESSION: 1. No acute intracranial abnormality identified. 2. Mild chronic microvascular ischemic changes and mild parenchymal volume loss of the brain. 3. 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 Electronically Signed   By: Mitzi Hansen M.D.   On: 08/06/2017 23:57   Dg Chest Portable 1 View  Result Date: 07/18/2017 CLINICAL DATA:  Chest pain, nausea, and overall weakness that started 3 days ago. EXAM: PORTABLE CHEST 1 VIEW COMPARISON:  04/27/2017. FINDINGS: Low lung volumes. Mild cardiac enlargement. Prior median sternotomy for CABG. Mild vascular congestion. No consolidation or edema. Mild scarring LEFT lung base is stable. IMPRESSION: Worsening aeration. Mild cardiac enlargement. Mild vascular congestion without consolidation edema. Electronically Signed   By: Elsie Stain M.D.   On: 07/18/2017 17:55    EKG: Normal sinus rhythm  Weights: Filed Weights   08/06/17 1750  Weight: 166 lb (75.3 kg)     Physical Exam: Blood pressure 110/77, pulse 86, temperature 98.5 F (36.9 C), temperature source Oral, resp. rate 18, height 5\' 7"  (1.702 m), weight 166 lb (75.3 kg), SpO2 96 %. Body mass index  is 26 kg/m. General: Well developed, well nourished, in no acute distress. Head eyes ears nose throat: Normocephalic, atraumatic, sclera non-icteric, no xanthomas, nares are without discharge. No apparent thyromegaly and/or mass  Lungs: Normal respiratory effort.  no wheezes, no rales, no rhonchi.  Heart: RRR with normal S1 S2. no murmur gallop, no rub, PMI is normal size and placement, carotid upstroke normal without bruit, jugular venous pressure is normal Abdomen: Soft, non-tender, non-distended with normoactive bowel sounds. No hepatomegaly. No rebound/guarding. No obvious abdominal masses. Abdominal aorta is normal size without bruit Extremities: Trace edema. no cyanosis, no clubbing, no ulcers  Peripheral : 2+ bilateral upper extremity pulses, 2+ bilateral femoral pulses, 2+ bilateral dorsal pedal pulse Neuro: Alert and oriented. No facial asymmetry. No focal deficit. Moves all extremities spontaneously. Musculoskeletal: Normal muscle tone without kyphosis Psych:  Responds to questions appropriately with a normal affect.    Assessment: 57 year old female with known coronary disease status post coronary bypass graft with recent non-ST elevation myocardial infarction hypertension hyperlipidemia with neurologic concerns now resolved and an elevated troponin possibly consistent with non-ST elevation myocardial infarction with no further chest discomfort continuing to be on appropriate risk reduction medication management  Plan: 1.  Discontinuation of heparin and continue dual antiplatelet therapy 2.  Begin ambulation and follow for improvements of symptoms and further need for adjustments of medication management 3.  No further cardiac intervention or cardiac catheterization at this time due to recent cardiac catheterization showing no large vessel coronary artery disease needing further intervention or PCI and stent placement 4.  High intensity cholesterol therapy 5.  Hypertension control  with carvedilol clonidine and ACE inhibitor if able 6.  Cardiac rehabilitation 7.  Okay for discharge home if ambulating well with follow-up later this week with Dr. call with  Signed, Lamar Blinks M.D. [facility] Cardiology 08/07/2017, 12:50 PM

## 2017-08-07 NOTE — Progress Notes (Signed)
Patient refused bed alarms at this time, patient educated about safety.

## 2017-08-07 NOTE — Progress Notes (Signed)
ANTICOAGULATION CONSULT NOTE - Initial Consult  Pharmacy Consult for Heparin Indication: chest pain/ACS  Allergies  Allergen Reactions  . Clindamycin/Lincomycin Other (See Comments)    Thrush   . Hydrocodone Bitartrate Er Itching  . Lidocaine     unknown  . Metformin And Related Other (See Comments)    Fatigue   . Oxycodone Other (See Comments)    Hallucination   . Penicillins Hives    Has patient had a PCN reaction causing immediate rash, facial/tongue/throat swelling, SOB or lightheadedness with hypotension: No Has patient had a PCN reaction causing severe rash involving mucus membranes or skin necrosis: No Has patient had a PCN reaction that required hospitalization: No Has patient had a PCN reaction occurring within the last 10 years: No If all of the above answers are "NO", then may proceed with Cephalosporin use.   . Pioglitazone Hcl-Glimepiride Hives  . Ramipril Swelling  . Sertraline Hcl Other (See Comments)    Hallucination   . Sitagliptin Other (See Comments)    Headache, chest pain  . Sulfur     unknown  . Ondansetron Rash    Patient Measurements: Height: 5\' 7"  (170.2 cm) Weight: 166 lb (75.3 kg) IBW/kg (Calculated) : 61.6  Vital Signs: Temp: 98.5 F (36.9 C) (02/26 0512) Temp Source: Oral (02/26 0512) BP: 110/77 (02/26 0512) Pulse Rate: 86 (02/26 0512)  Labs: Recent Labs    08/06/17 1754 08/06/17 2036 08/06/17 2116 08/07/17 0357 08/07/17 0614  HGB 12.5  --   --  11.7*  --   HCT 36.8  --   --  34.0*  --   PLT 281  --   --  249  --   APTT  --   --  33  --   --   LABPROT  --   --  12.6  --   --   INR  --   --  0.95  --   --   HEPARINUNFRC  --   --   --   --  0.23*  CREATININE 1.16*  --   --  1.07*  --   TROPONINI 0.72* 0.66*  --  1.07*  --     Estimated Creatinine Clearance: 63.7 mL/min (A) (by C-G formula based on SCr of 1.07 mg/dL (H)).   Assessment: 57 y/o F with a known h/o CAD not on anticoagulants ordered heparin drip for r/o  ACS.    Goal of Therapy:  Heparin level 0.3-0.7 units/ml Monitor platelets by anticoagulation protocol: Yes   Plan:  2/26 AM HL subtherapeutic at 0.23. Will order 1000u bolus and increase infusion to 1050u/hr. Recheck HL in 6 hours.    Gardner CandleSheema M Miko Markwood, PharmD, BCPS Clinical Pharmacist 08/07/2017 8:05 AM

## 2017-08-08 LAB — HEMOGLOBIN A1C
Hgb A1c MFr Bld: 11.2 % — ABNORMAL HIGH (ref 4.8–5.6)
MEAN PLASMA GLUCOSE: 275 mg/dL

## 2017-08-08 LAB — GLUCOSE, CAPILLARY
GLUCOSE-CAPILLARY: 156 mg/dL — AB (ref 65–99)
GLUCOSE-CAPILLARY: 103 mg/dL — AB (ref 65–99)

## 2017-08-08 MED ORDER — TRAMADOL HCL 50 MG PO TABS: 50.0000 mg | ORAL_TABLET | Freq: Four times a day (QID) | ORAL | 0 refills | Status: DC | PRN

## 2017-08-08 MED ORDER — CLONIDINE HCL 0.1 MG PO TABS: 0.1000 mg | ORAL_TABLET | Freq: Two times a day (BID) | ORAL | 0 refills | Status: AC | PRN

## 2017-08-08 MED ORDER — INSULIN DETEMIR 100 UNIT/ML FLEXPEN: 80.0000 [IU] | Freq: Every day | SUBCUTANEOUS | 0 refills | Status: AC

## 2017-08-08 MED ORDER — TRAMADOL HCL 50 MG PO TABS: 50.0000 mg | ORAL_TABLET | Freq: Four times a day (QID) | ORAL | 0 refills | Status: AC | PRN

## 2017-08-08 MED ORDER — CARVEDILOL PHOSPHATE ER 20 MG PO CP24
20.0000 mg | ORAL_CAPSULE | Freq: Every day | ORAL | Status: DC
  Filled 2017-08-08: qty 1

## 2017-08-08 NOTE — Discharge Summary (Signed)
[facility] [place] at [facility]   PATIENT NAME: Desiree Carey    MR#:  [id number]  DATE OF BIRTH:  17-Jun-1960  DATE OF ADMISSION:  08/06/2017 ADMITTING PHYSICIAN: Altamese Dilling, MD  DATE OF DISCHARGE:  08/08/17  PRIMARY CARE PHYSICIAN: Emogene Morgan, MD    ADMISSION DIAGNOSIS:  Paresthesia [R20.2] Cerebral infarction (HCC) [I63.9] CVA (cerebral infarction) [I63.9] Elevated troponin [R74.8]  DISCHARGE DIAGNOSIS:  Principal Problem:   TIA (transient ischemic attack)   SECONDARY DIAGNOSIS:   Past Medical History:  Diagnosis Date  . Anemia   . Anxiety   . Asthma   . CAD (coronary artery disease)    CABG 2012  . Depression   . Diabetes mellitus without complication (HCC)   . Diverticulitis   . Heart attack (HCC)   . Hyperlipemia   . Hypertension   . IBS (irritable bowel syndrome)   . Thyroid disease     HOSPITAL COURSE:   HISTORY OF PRESENT ILLNESS: Desiree Carey  is a 57 y.o. female with a known history of anxiety, asthma, coronary artery disease status post CABG and recent non-ST elevation MI and catheterization 2 weeks ago, diabetes, diverticulitis, hyperlipidemia, hypertension, thyroid disease- since morning today has numbness on her left arm but able to move it and hold things with that. So she continue doing her day-to-day work but until afternoon it didn't go away so called cardiologist office. They suggested to go to emergency room for further workup. She also had some complaining of bilateral calf pain for last few weeks and for that cardiologist and referred her to vascular surgery where she had not have an appointment yet.  In ER CT scan of the head was negative. But patient's symptoms continued and she had slightly elevated troponin. ER physician spoke to on call cardiologist and they suggested as 2 weeks ago troponin was much higher with a non-ST elevation MI, likely this slight elevation in troponin is continuation  of that but because of her symptoms she may need to be observed in the hospital as she had significant coronary artery disease as per recent catheter. She denies any associated chest pain or palpitation today. She denies any headache, loss of vision.  *TIA with left forearm and hand numbness Clinically improved, symptoms resolved symptoms resolved CT head and MRI of the brain are negative  carotid Dopplers-calcified plaque at the right carotid bifurcation and left carotid bifurcation contributing to 50-69% stenosis  Patient had a recent echocardiogram 2-3 months ago, no need to repeat Lipid panel-LDL 62LDL 62 Continue antiplatelets and statins Needs a tight control of the risk factors   *Elevated troponin Troponin 0 0.72-0.66-1.07 Patient is a symptomatic Most likely this is continuation from 2 weeks ago when troponin was much higher She is on optimal cardiac medication after recent non-ST elevation MI and catheterization. Cardiology discontinued heparin drip, plan is to ambulate the patient and monitor clinically Discharge patient home  *Hypotension with a history of hypertension Hold home medications Coreg,  Imdur and lisinopril.  Patient can take clonidine as needed basis for high  blood pressure hold  home medication, BP low   *Diabetes Continue basal insulin and keep on sliding-scale coverage.  *Hyperlipidemia Continue simvastatin. LDL 62   *Hypothyroidism Continue levothyroxine.  *Active smoking Counseled to quit smoking for 4 minutes and offered nicotine patch.     DISCHARGE CONDITIONS:   stable  CONSULTS OBTAINED:  Treatment Team:  Lamar Blinks, MD   PROCEDURES  None  DRUG ALLERGIES:   Allergies  Allergen Reactions  . Clindamycin/Lincomycin Other (See Comments)    Thrush   . Hydrocodone Bitartrate Er Itching  . Lidocaine     unknown  . Metformin And Related Other (See Comments)    Fatigue   . Oxycodone Other  (See Comments)    Hallucination   . Penicillins Hives    Has patient had a PCN reaction causing immediate rash, facial/tongue/throat swelling, SOB or lightheadedness with hypotension: No Has patient had a PCN reaction causing severe rash involving mucus membranes or skin necrosis: No Has patient had a PCN reaction that required hospitalization: No Has patient had a PCN reaction occurring within the last 10 years: No If all of the above answers are "NO", then may proceed with Cephalosporin use.   . Pioglitazone Hcl-Glimepiride Hives  . Ramipril Swelling  . Sertraline Hcl Other (See Comments)    Hallucination   . Sitagliptin Other (See Comments)    Headache, chest pain  . Sulfur     unknown  . Ondansetron Rash    DISCHARGE MEDICATIONS:   Allergies as of 08/08/2017      Reactions   Clindamycin/lincomycin Other (See Comments)   Thrush   Hydrocodone Bitartrate Er Itching   Lidocaine    unknown   Metformin And Related Other (See Comments)   Fatigue   Oxycodone Other (See Comments)   Hallucination   Penicillins Hives   Has patient had a PCN reaction causing immediate rash, facial/tongue/throat swelling, SOB or lightheadedness with hypotension: No Has patient had a PCN reaction causing severe rash involving mucus membranes or skin necrosis: No Has patient had a PCN reaction that required hospitalization: No Has patient had a PCN reaction occurring within the last 10 years: No If all of the above answers are "NO", then may proceed with Cephalosporin use.   Pioglitazone Hcl-glimepiride Hives   Ramipril Swelling   Sertraline Hcl Other (See Comments)   Hallucination   Sitagliptin Other (See Comments)   Headache, chest pain   Sulfur    unknown   Ondansetron Rash      Medication List    STOP taking these medications   carvedilol 40 MG 24 hr capsule Commonly known as:  COREG CR   hydrochlorothiazide 25 MG tablet Commonly known as:  HYDRODIURIL   isosorbide mononitrate 120  MG 24 hr tablet Commonly known as:  IMDUR   lisinopril 2.5 MG tablet Commonly known as:  PRINIVIL,ZESTRIL     TAKE these medications   aspirin 81 MG EC tablet Take 1 tablet (81 mg total) by mouth daily.   cloNIDine 0.1 MG tablet Commonly known as:  CATAPRES Take 1 tablet (0.1 mg total) by mouth 2 (two) times daily as needed. if systolic blood pressure is greater than 120 or diastolic blood pressure greater than 100 What changed:    when to take this  reasons to take this  additional instructions   clopidogrel 75 MG tablet Commonly known as:  PLAVIX Take 1 tablet (75 mg total) by mouth daily.   DULoxetine 30 MG capsule Commonly known as:  CYMBALTA Take 30 mg by mouth daily.   gabapentin 800 MG tablet Commonly known as:  NEURONTIN Take 800 mg by mouth 4 (four) times daily.   insulin aspart 100 UNIT/ML injection Commonly known as:  novoLOG Inject 32 Units into the skin 3 (three) times daily with meals.   insulin detemir 100 unit/ml Soln Commonly known as:  LEVEMIR Inject 0.8 mLs (80 Units  total) into the skin at bedtime.   levothyroxine 175 MCG tablet Commonly known as:  SYNTHROID, LEVOTHROID Take 175 mcg by mouth daily before breakfast.   omeprazole 40 MG capsule Commonly known as:  PRILOSEC Take 40 mg by mouth daily.   potassium chloride SA 20 MEQ tablet Commonly known as:  K-DUR,KLOR-CON Take 20 mEq by mouth.   promethazine 12.5 MG tablet Commonly known as:  PHENERGAN Take 1 tablet (12.5 mg total) by mouth every 8 (eight) hours as needed for nausea or vomiting.   ranolazine 500 MG 12 hr tablet Commonly known as:  RANEXA Take 1 tablet (500 mg total) by mouth 2 (two) times daily.   simvastatin 40 MG tablet Commonly known as:  ZOCOR Take 40 mg by mouth daily.   traMADol 50 MG tablet Commonly known as:  ULTRAM Take 1 tablet (50 mg total) by mouth every 6 (six) hours as needed for moderate pain.        DISCHARGE INSTRUCTIONS:   Follow-up with  primary care physician in 1 week Follow-up with cardiology Dr. Gwen Pounds in 1 week  DIET:  Cardiac diet and Diabetic diet  DISCHARGE CONDITION:  Stable  ACTIVITY:  Activity as tolerated  OXYGEN:  Home Oxygen: No.   Oxygen Delivery: room air  DISCHARGE LOCATION:  home   If you experience worsening of your admission symptoms, develop shortness of breath, life threatening emergency, suicidal or homicidal thoughts you must seek medical attention immediately by calling 911 or calling your MD immediately  if symptoms less severe.  You Must read complete instructions/literature along with all the possible adverse reactions/side effects for all the Medicines you take and that have been prescribed to you. Take any new Medicines after you have completely understood and accpet all the possible adverse reactions/side effects.   Please note  You were cared for by a hospitalist during your hospital stay. If you have any questions about your discharge medications or the care you received while you were in the hospital after you are discharged, you can call the unit and asked to speak with the hospitalist on call if the hospitalist that took care of you is not available. Once you are discharged, your primary care physician will handle any further medical issues. Please note that NO REFILLS for any discharge medications will be authorized once you are discharged, as it is imperative that you return to your primary care physician (or establish a relationship with a primary care physician if you do not have one) for your aftercare needs so that they can reassess your need for medications and monitor your lab values.     Today  Chief Complaint  Patient presents with  . Numbness   Patient is feeling fine.  All her symptoms are resolved denies any chest pain wants to go home  ROS:  CONSTITUTIONAL: Denies fevers, chills. Denies any fatigue, weakness.  EYES: Denies blurry vision, double vision, eye  pain. EARS, NOSE, THROAT: Denies tinnitus, ear pain, hearing loss. RESPIRATORY: Denies cough, wheeze, shortness of breath.  CARDIOVASCULAR: Denies chest pain, palpitations, edema.  GASTROINTESTINAL: Denies nausea, vomiting, diarrhea, abdominal pain. Denies bright red blood per rectum. GENITOURINARY: Denies dysuria, hematuria. ENDOCRINE: Denies nocturia or thyroid problems. HEMATOLOGIC AND LYMPHATIC: Denies easy bruising or bleeding. SKIN: Denies rash or lesion. MUSCULOSKELETAL: Denies pain in neck, back, shoulder, knees, hips or arthritic symptoms.  NEUROLOGIC: Denies paralysis, paresthesias.  PSYCHIATRIC: Denies anxiety or depressive symptoms.   VITAL SIGNS:  Blood pressure (!) 95/56, pulse 75, temperature  98.4 F (36.9 C), temperature source Oral, resp. rate (!) 21, height 5\' 7"  (1.702 m), weight 75.3 kg (166 lb), SpO2 98 %.  I/O:    Intake/Output Summary (Last 24 hours) at 08/08/2017 1150 Last data filed at 08/08/2017 1029 Gross per 24 hour  Intake 240 ml  Output -  Net 240 ml    PHYSICAL EXAMINATION:  GENERAL:  57 y.o.-year-old patient lying in the bed with no acute distress.  EYES: Pupils equal, round, reactive to light and accommodation. No scleral icterus. Extraocular muscles intact.  HEENT: Head atraumatic, normocephalic. Oropharynx and nasopharynx clear.  NECK:  Supple, no jugular venous distention. No thyroid enlargement, no tenderness.  LUNGS: Normal breath sounds bilaterally, no wheezing, rales,rhonchi or crepitation. No use of accessory muscles of respiration.  CARDIOVASCULAR: S1, S2 normal. No murmurs, rubs, or gallops.  ABDOMEN: Soft, non-tender, non-distended. Bowel sounds present. No organomegaly or mass.  EXTREMITIES: No pedal edema, cyanosis, or clubbing.  NEUROLOGIC: Cranial nerves II through XII are intact. Muscle strength 5/5 in all extremities. Sensation intact. Gait not checked.  PSYCHIATRIC: The patient is alert and oriented x 3.  SKIN: No obvious rash,  lesion, or ulcer.   DATA REVIEW:   CBC Recent Labs  Lab 08/07/17 0357  WBC 7.8  HGB 11.7*  HCT 34.0*  PLT 249    Chemistries  Recent Labs  Lab 08/06/17 1754 08/07/17 0357  NA 133* 134*  K 3.7 3.7  CL 99* 102  CO2 24 24  GLUCOSE 471* 420*  BUN 17 16  CREATININE 1.16* 1.07*  CALCIUM 8.7* 8.4*  AST 16  --   ALT 10*  --   ALKPHOS 62  --   BILITOT 0.3  --     Cardiac Enzymes Recent Labs  Lab 08/07/17 0357  TROPONINI 1.07*    Microbiology Results  Results for orders placed or performed during the hospital encounter of 07/18/17  MRSA PCR Screening     Status: None   Collection Time: 07/18/17  9:05 PM  Result Value Ref Range Status   MRSA by PCR NEGATIVE NEGATIVE Final    Comment:        The GeneXpert MRSA Assay (FDA approved for NASAL specimens only), is one component of a comprehensive MRSA colonization surveillance program. It is not intended to diagnose MRSA infection nor to guide or monitor treatment for MRSA infections. Performed at [facility], [address] [place], [place] [address]     RADIOLOGY:  Ct Head Wo Contrast  Result Date: 08/06/2017 CLINICAL DATA:  Left hand numbness EXAM: CT HEAD WITHOUT CONTRAST TECHNIQUE: Contiguous axial images were obtained from the base of the skull through the vertex without intravenous contrast. COMPARISON:  None. FINDINGS: Brain: No acute territorial infarction, hemorrhage or intracranial mass is visualized. Mild small vessel ischemic changes of the white matter. More focal hypodensity within the right frontal white matter and basal ganglia probably reflects remote infarct. Mild ex vacuo dilatation of the right anterior ventricle. Ventricles are nonenlarged. Vascular: No hyperdense vessels. Vertebral artery calcification. Carotid artery calcification Skull: Normal. Negative for fracture or focal lesion. Sinuses/Orbits: No acute finding. Other: None IMPRESSION: 1. No definite CT evidence for acute  intracranial abnormality 2. Mild small vessel ischemic changes of the white matter. Electronically Signed   By: Jasmine PangKim  Fujinaga M.D.   On: 08/06/2017 20:18   Mr Brain Wo Contrast  Result Date: 08/06/2017 CLINICAL DATA:  57 y/o F; oval with left hand numbness, numbness and right hand fingers  beginning at approximately 1600 hours. EXAM: MRI HEAD WITHOUT CONTRAST TECHNIQUE: Multiplanar, multiecho pulse sequences of the brain and surrounding structures were obtained without intravenous contrast. COMPARISON:  08/06/2017 CT head. FINDINGS: Brain: No acute infarction, hemorrhage, hydrocephalus, extra-axial collection or mass lesion. Small chronic infarction extending from right lentiform nucleus into frontal periventricular white matter. Small chronic lacunar infarctions within left thalamus and left mid corona radiata. Fewnonspecific foci of T2 FLAIR hyperintense signal abnormality in subcortical and periventricular white matter are compatible withmildchronic microvascular ischemic changes for age. Mildbrain parenchymal volume loss. Vascular: Normal flow voids. Skull and upper cervical spine: Normal marrow signal. Sinuses/Orbits: Negative. Other: None. IMPRESSION: 1. No acute intracranial abnormality identified. 2. Mild chronic microvascular ischemic changes and mild parenchymal volume loss of the brain. 3. 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 Electronically Signed   By: Mitzi Hansen M.D.   On: 08/06/2017 23:57   US Carotid Bilateral  Result Date: 08/07/2017 CLINICAL DATA:  57 year old female with a history of cerebrovascular accident. Cardiovascular risk factors include hypertension, known prior stroke/TIA, known coronary disease, hyperlipidemia, diabetes, tobacco use EXAM: BILATERAL CAROTID DUPLEX ULTRASOUND TECHNIQUE: Wallace Cullens scale imaging, color Doppler and duplex  ultrasound were performed of bilateral carotid and vertebral arteries in the neck. COMPARISON:  No prior duplex FINDINGS: Criteria: Quantification of carotid stenosis is based on velocity parameters that correlate the residual internal carotid diameter with NASCET-based stenosis levels, using the diameter of the distal internal carotid lumen as the denominator for stenosis measurement. The following velocity measurements were obtained: RIGHT ICA:  Systolic 130 cm/sec, Diastolic 58 cm/sec CCA:  64 cm/sec SYSTOLIC ICA/CCA RATIO:  2.0 ECA:  149 cm/sec LEFT ICA:  Systolic 204 cm/sec, Diastolic 79 cm/sec CCA:  85 cm/sec SYSTOLIC ICA/CCA RATIO:  2.4 ECA:  269 cm/sec Right Brachial SBP: Not acquired Left Brachial SBP: Not acquired RIGHT CAROTID ARTERY: No significant calcifications of the right common carotid artery. Intermediate waveform maintained. Moderate heterogeneous and partially calcified plaque at the right carotid bifurcation. No significant lumen shadowing. Low resistance waveform of the right ICA. No significant tortuosity. RIGHT VERTEBRAL ARTERY: Antegrade flow with low resistance waveform. LEFT CAROTID ARTERY: No significant calcifications of the left common carotid artery. Intermediate waveform maintained. Moderate heterogeneous and partially calcified plaque at the left carotid bifurcation. No significant lumen shadowing. Low resistance waveform of the left ICA. No significant tortuosity. LEFT VERTEBRAL ARTERY:  Antegrade flow with low resistance waveform. IMPRESSION: Right: Heterogeneous and partially calcified plaque at the right carotid bifurcation contributes to 50%-69% stenosis by established duplex criteria. Left: Heterogeneous and partially calcified plaque at the left carotid bifurcation contributes to 50%-69% stenosis by established duplex criteria. Signed, Yvone Neu. Loreta Ave, DO [facility] Electronically Signed   By: Gilmer Mor D.O.   On:  08/07/2017 16:53    EKG:   Orders placed or performed during the hospital encounter of 08/06/17  . ED EKG  . ED EKG      Management plans discussed with the patient, family and they are in agreement.  CODE STATUS:     Code Status Orders  (From admission, onward)        Start     Ordered   08/06/17 2352  Full code  Continuous     08/06/17 2351    Code Status History    Date Active Date Inactive Code Status Order ID Comments  User Context   07/18/2017 18:54 07/20/2017 18:45 Full Code [id number]  Milagros Loll, MD ED   04/27/2017 22:49 04/29/2017 00:13 Full Code [id number]  Oralia Manis, MD Inpatient      TOTAL TIME TAKING CARE OF THIS PATIENT: 43 minutes.   Note: This dictation was prepared with Dragon dictation along with smaller phrase technology. Any transcriptional errors that result from this process are unintentional.   @[facility] @  on 08/08/2017 at 11:50 AM  Between 7am to 6pm - Pager - [phone]  After 6pm go to [email or web address] - password [email or web address] [facility]  [facility]  Office  [phone]  CC: Primary care physician; Emogene Morgan, MD

## 2017-08-08 NOTE — Discharge Instructions (Signed)
Follow-up with primary care physician in 1 week °Follow-up with cardiology Dr. Kowalski in 1 week ° °

## 2018-02-10 DEATH — deceased

## 2019-03-26 IMAGING — CT CT HEAD W/O CM
3 series · 16 of 47 positions shown, 19 images · non-contrast
Comparison: None.

CLINICAL DATA: Left hand numbness

EXAM:
CT HEAD WITHOUT CONTRAST
TECHNIQUE: Contiguous axial images were obtained from the base of the skull
through the vertex without intravenous contrast.

[Series 2: head wo · axial · 0.47mm/px · z∈[-177,-52]mm · 10 of 31 slices shown, 13 images]
[im 3/31  brain]
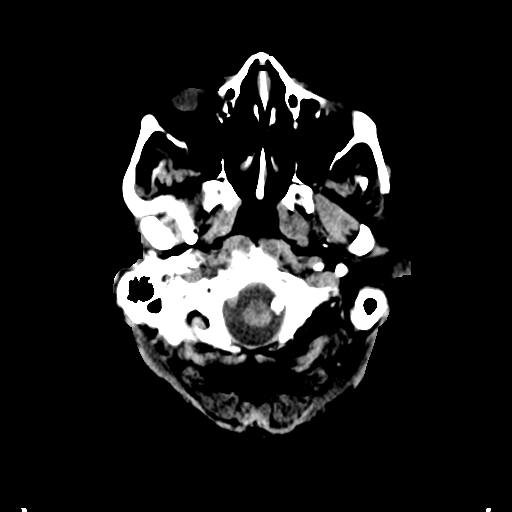
[im 3/31  bone]
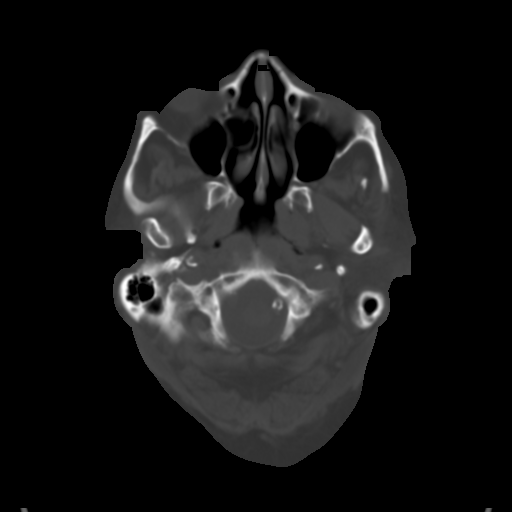
[im 6/31  brain]
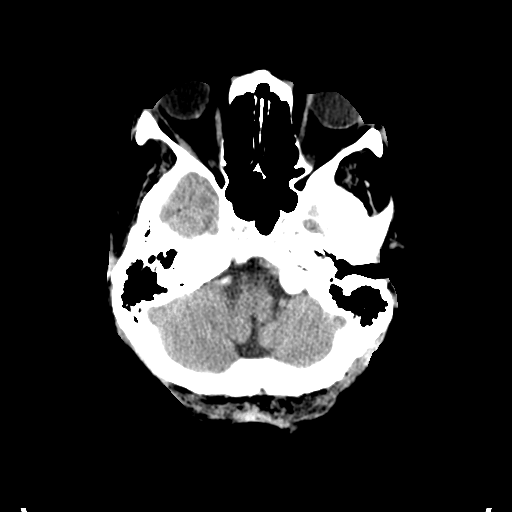
[im 9/31  brain]
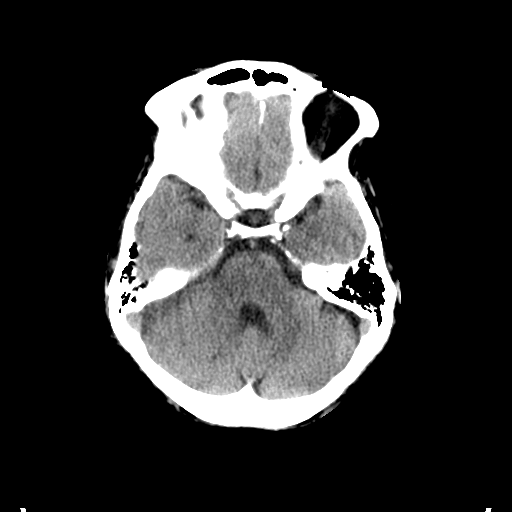
[im 11/31  brain]
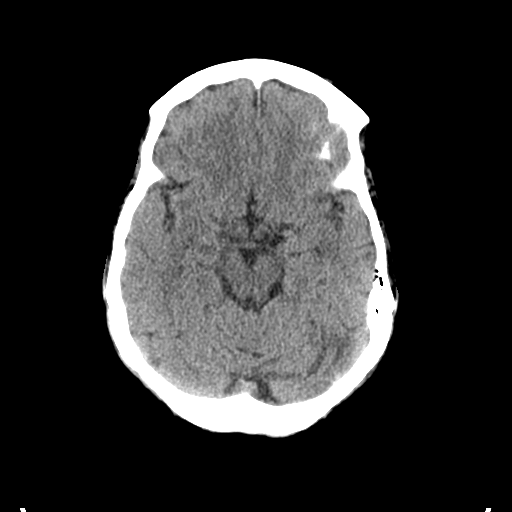
[im 14/31  brain]
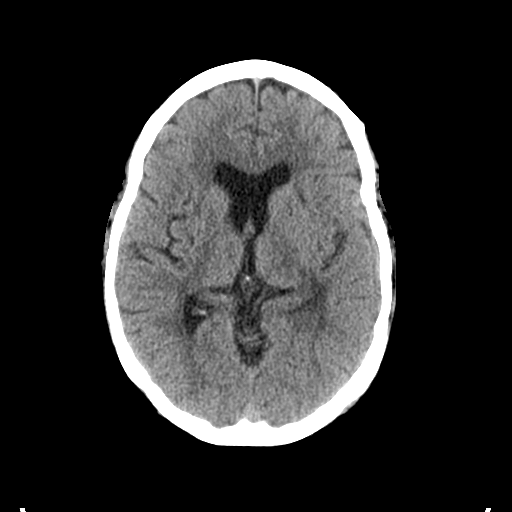
[im 14/31  bone]
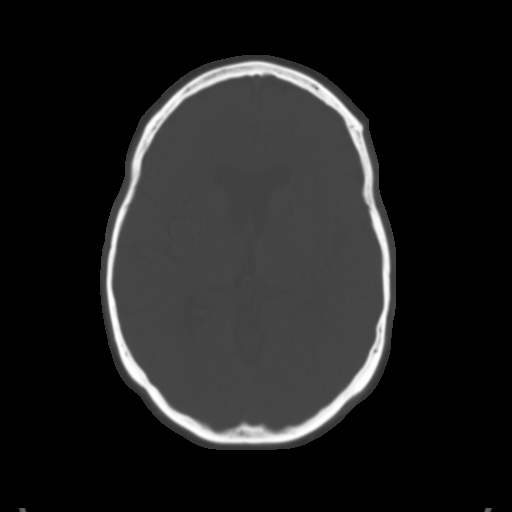
[im 17/31  brain]
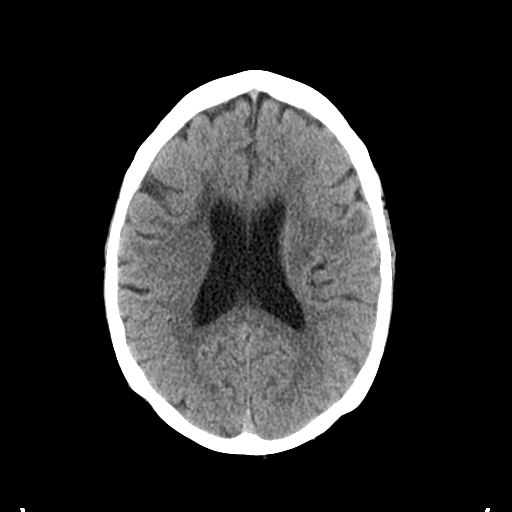
[im 20/31  brain]
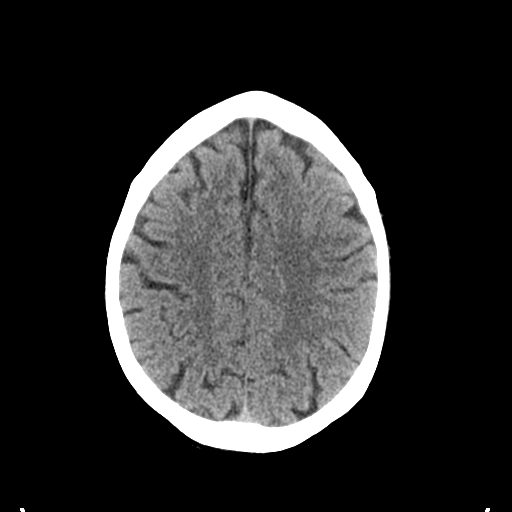
[im 23/31  brain]
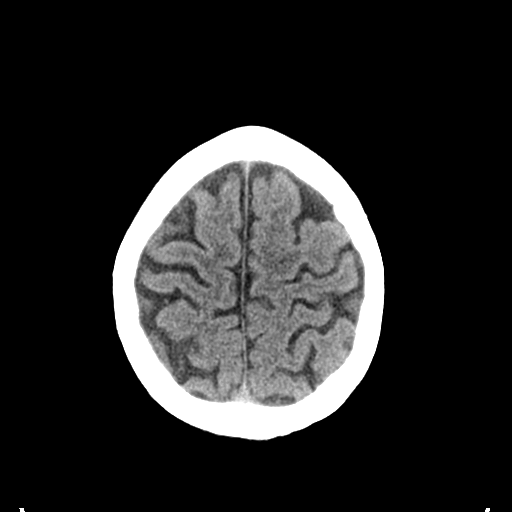
[im 25/31  brain]
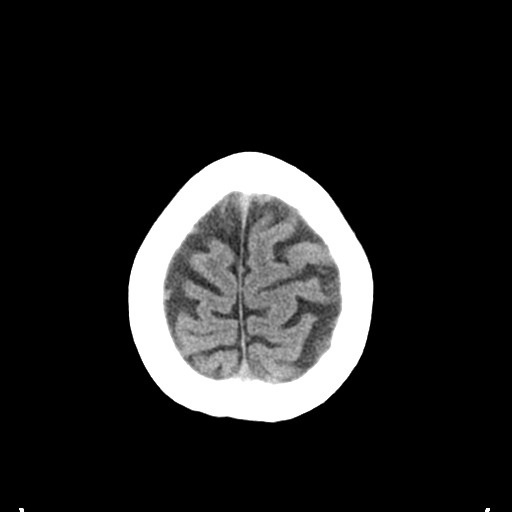
[im 25/31  bone]
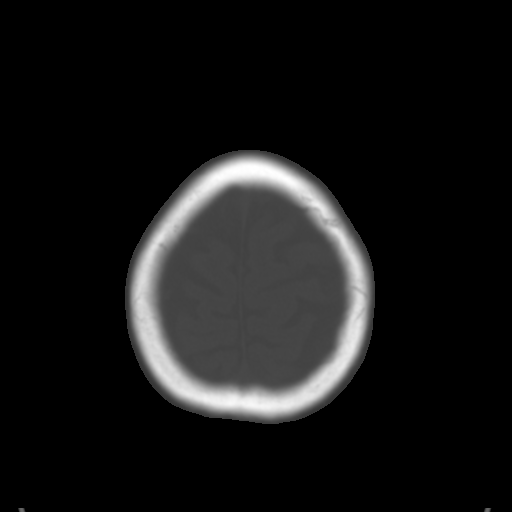
[im 28/31  brain]
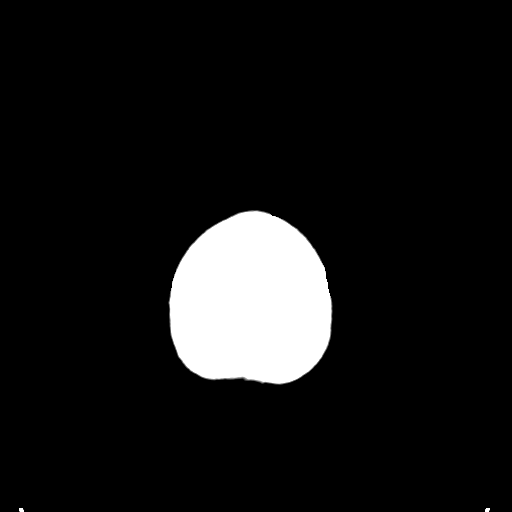

[Series 4: coronal soft tissue · coronal · 0.30mm/px · 3 of 68 slices shown]
[im 23/68  brain]
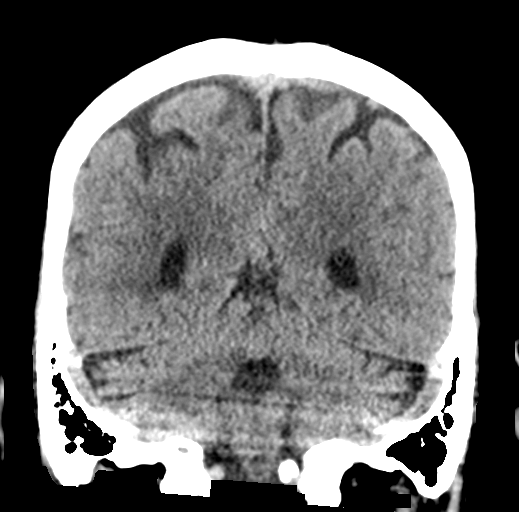
[im 30/68  brain]
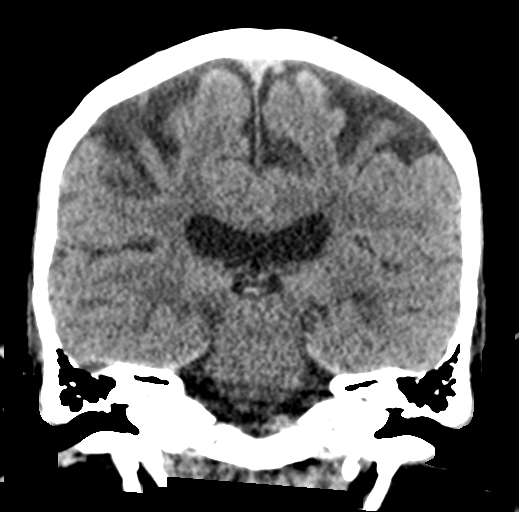
[im 38/68  brain]
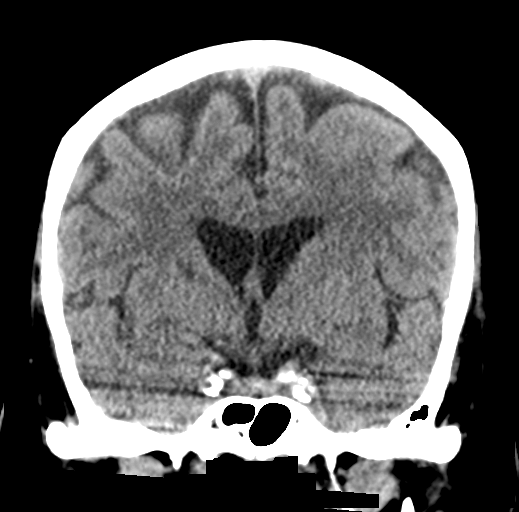

[Series 5: sagittal soft tissue · sagittal · 0.30mm/px · 3 of 50 slices shown]
[im 17/50  brain]
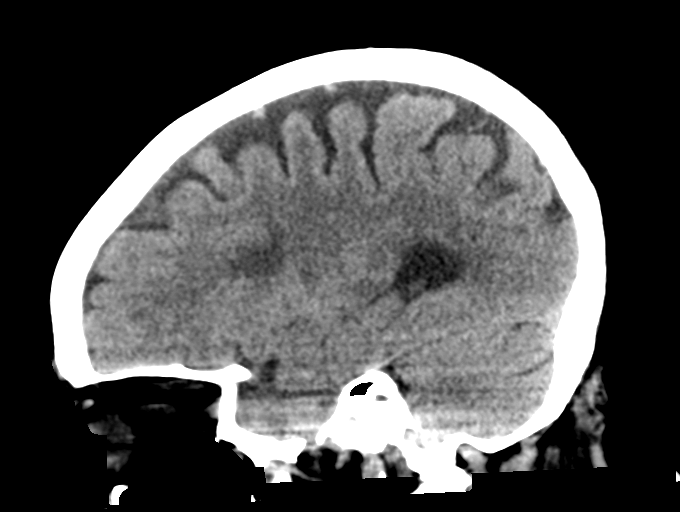
[im 25/50  brain]
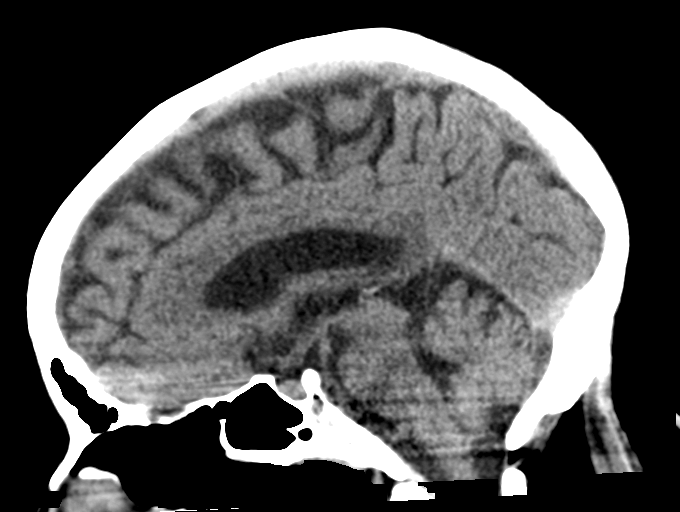
[im 33/50  brain]
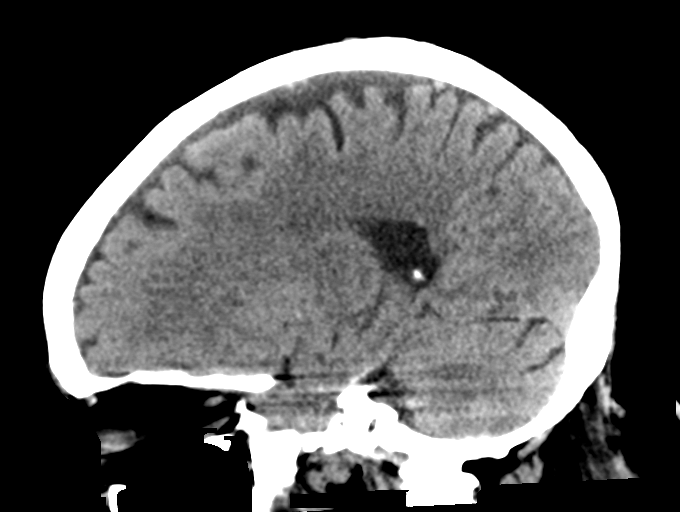

[16 of 47 positions shown; findings below may reference images not displayed]

FINDINGS: Brain: No acute territorial infarction, hemorrhage or intracranial
mass is visualized. Mild small vessel ischemic changes of the white
matter. More focal hypodensity within the right frontal white matter
and basal ganglia probably reflects remote infarct. Mild ex vacuo
dilatation of the right anterior ventricle. Ventricles are
nonenlarged.

Vascular: No hyperdense vessels. Vertebral artery calcification.
Carotid artery calcification

Skull: Normal. Negative for fracture or focal lesion.

Sinuses/Orbits: No acute finding.

Other: None
IMPRESSION: 1. No definite CT evidence for acute intracranial abnormality
2. Mild small vessel ischemic changes of the white matter.
